# Patient Record
Sex: Female | Born: 1985 | Race: White | Hispanic: No | Marital: Single | Smoking: Current some day smoker
Health system: Southern US, Community
[De-identification: ages and names within clinical notes are randomized; demographics above are authoritative.]

## PROBLEM LIST (undated history)

## (undated) DIAGNOSIS — F419 Anxiety disorder, unspecified: Secondary | ICD-10-CM

## (undated) DIAGNOSIS — A6000 Herpesviral infection of urogenital system, unspecified: Secondary | ICD-10-CM

## (undated) DIAGNOSIS — F329 Major depressive disorder, single episode, unspecified: Secondary | ICD-10-CM

## (undated) DIAGNOSIS — F1910 Other psychoactive substance abuse, uncomplicated: Secondary | ICD-10-CM

## (undated) DIAGNOSIS — F32A Depression, unspecified: Secondary | ICD-10-CM

## (undated) DIAGNOSIS — N61 Mastitis without abscess: Secondary | ICD-10-CM

## (undated) HISTORY — PX: NO PAST SURGERIES: SHX2092

## (undated) HISTORY — DX: Mastitis without abscess: N61.0

## (undated) HISTORY — DX: Herpesviral infection of urogenital system, unspecified: A60.00

---

## 2005-02-23 ENCOUNTER — Emergency Department (HOSPITAL_COMMUNITY): Admission: EM | Admit: 2005-02-23 | Discharge: 2005-02-23 | Payer: Self-pay | Admitting: *Deleted

## 2005-09-20 ENCOUNTER — Emergency Department (HOSPITAL_COMMUNITY): Admission: EM | Admit: 2005-09-20 | Discharge: 2005-09-20 | Payer: Self-pay | Admitting: Emergency Medicine

## 2006-10-14 ENCOUNTER — Emergency Department (HOSPITAL_COMMUNITY): Admission: EM | Admit: 2006-10-14 | Discharge: 2006-10-14 | Payer: Self-pay | Admitting: Emergency Medicine

## 2007-05-30 ENCOUNTER — Emergency Department (HOSPITAL_COMMUNITY): Admission: EM | Admit: 2007-05-30 | Discharge: 2007-05-30 | Payer: Self-pay | Admitting: *Deleted

## 2007-06-28 ENCOUNTER — Emergency Department (HOSPITAL_COMMUNITY): Admission: EM | Admit: 2007-06-28 | Discharge: 2007-06-29 | Payer: Self-pay | Admitting: Emergency Medicine

## 2007-08-29 ENCOUNTER — Emergency Department (HOSPITAL_COMMUNITY): Admission: EM | Admit: 2007-08-29 | Discharge: 2007-08-29 | Payer: Self-pay | Admitting: Emergency Medicine

## 2007-09-08 ENCOUNTER — Emergency Department (HOSPITAL_COMMUNITY): Admission: EM | Admit: 2007-09-08 | Discharge: 2007-09-08 | Payer: Self-pay | Admitting: Emergency Medicine

## 2008-03-03 ENCOUNTER — Emergency Department (HOSPITAL_COMMUNITY): Admission: EM | Admit: 2008-03-03 | Discharge: 2008-03-03 | Payer: Self-pay | Admitting: Emergency Medicine

## 2008-06-03 ENCOUNTER — Emergency Department (HOSPITAL_COMMUNITY): Admission: EM | Admit: 2008-06-03 | Discharge: 2008-06-03 | Payer: Self-pay | Admitting: Emergency Medicine

## 2008-08-15 ENCOUNTER — Emergency Department (HOSPITAL_COMMUNITY): Admission: EM | Admit: 2008-08-15 | Discharge: 2008-08-15 | Payer: Self-pay | Admitting: Emergency Medicine

## 2008-08-25 ENCOUNTER — Emergency Department (HOSPITAL_COMMUNITY): Admission: EM | Admit: 2008-08-25 | Discharge: 2008-08-25 | Payer: Self-pay | Admitting: Emergency Medicine

## 2009-01-02 ENCOUNTER — Emergency Department (HOSPITAL_COMMUNITY): Admission: EM | Admit: 2009-01-02 | Discharge: 2009-01-02 | Payer: Self-pay | Admitting: Emergency Medicine

## 2009-01-02 ENCOUNTER — Ambulatory Visit: Payer: Self-pay | Admitting: Psychiatry

## 2009-01-02 ENCOUNTER — Inpatient Hospital Stay (HOSPITAL_COMMUNITY): Admission: AD | Admit: 2009-01-02 | Discharge: 2009-01-03 | Payer: Self-pay | Admitting: Psychiatry

## 2009-01-14 ENCOUNTER — Emergency Department (HOSPITAL_COMMUNITY): Admission: EM | Admit: 2009-01-14 | Discharge: 2009-01-14 | Payer: Self-pay | Admitting: Emergency Medicine

## 2009-07-10 ENCOUNTER — Emergency Department (HOSPITAL_COMMUNITY): Admission: EM | Admit: 2009-07-10 | Discharge: 2009-07-10 | Payer: Self-pay | Admitting: Emergency Medicine

## 2009-10-28 ENCOUNTER — Emergency Department (HOSPITAL_COMMUNITY): Admission: EM | Admit: 2009-10-28 | Discharge: 2009-10-28 | Payer: Self-pay | Admitting: Emergency Medicine

## 2009-12-05 ENCOUNTER — Emergency Department (HOSPITAL_COMMUNITY): Admission: EM | Admit: 2009-12-05 | Discharge: 2009-12-05 | Payer: Self-pay | Admitting: Emergency Medicine

## 2010-05-23 ENCOUNTER — Emergency Department (HOSPITAL_COMMUNITY): Admission: EM | Admit: 2010-05-23 | Discharge: 2010-05-23 | Payer: Self-pay | Admitting: Emergency Medicine

## 2010-05-27 ENCOUNTER — Emergency Department (HOSPITAL_COMMUNITY): Admission: EM | Admit: 2010-05-27 | Discharge: 2010-05-27 | Payer: Self-pay | Admitting: Emergency Medicine

## 2010-12-03 ENCOUNTER — Emergency Department (HOSPITAL_COMMUNITY)
Admission: EM | Admit: 2010-12-03 | Discharge: 2010-12-03 | Payer: Self-pay | Source: Home / Self Care | Admitting: Emergency Medicine

## 2010-12-28 IMAGING — CR DG FOOT COMPLETE 3+V*R*
3 series · 3 of 3 positions shown · non-contrast
Comparison: [REDACTED]right ankle radiographs 05/23/2010.

CLINICAL DATA: Right hind foot pain radiating to plantar foot
without specific injury.

RIGHT FOOT COMPLETE - 3+ VIEW

[t foot ap left]
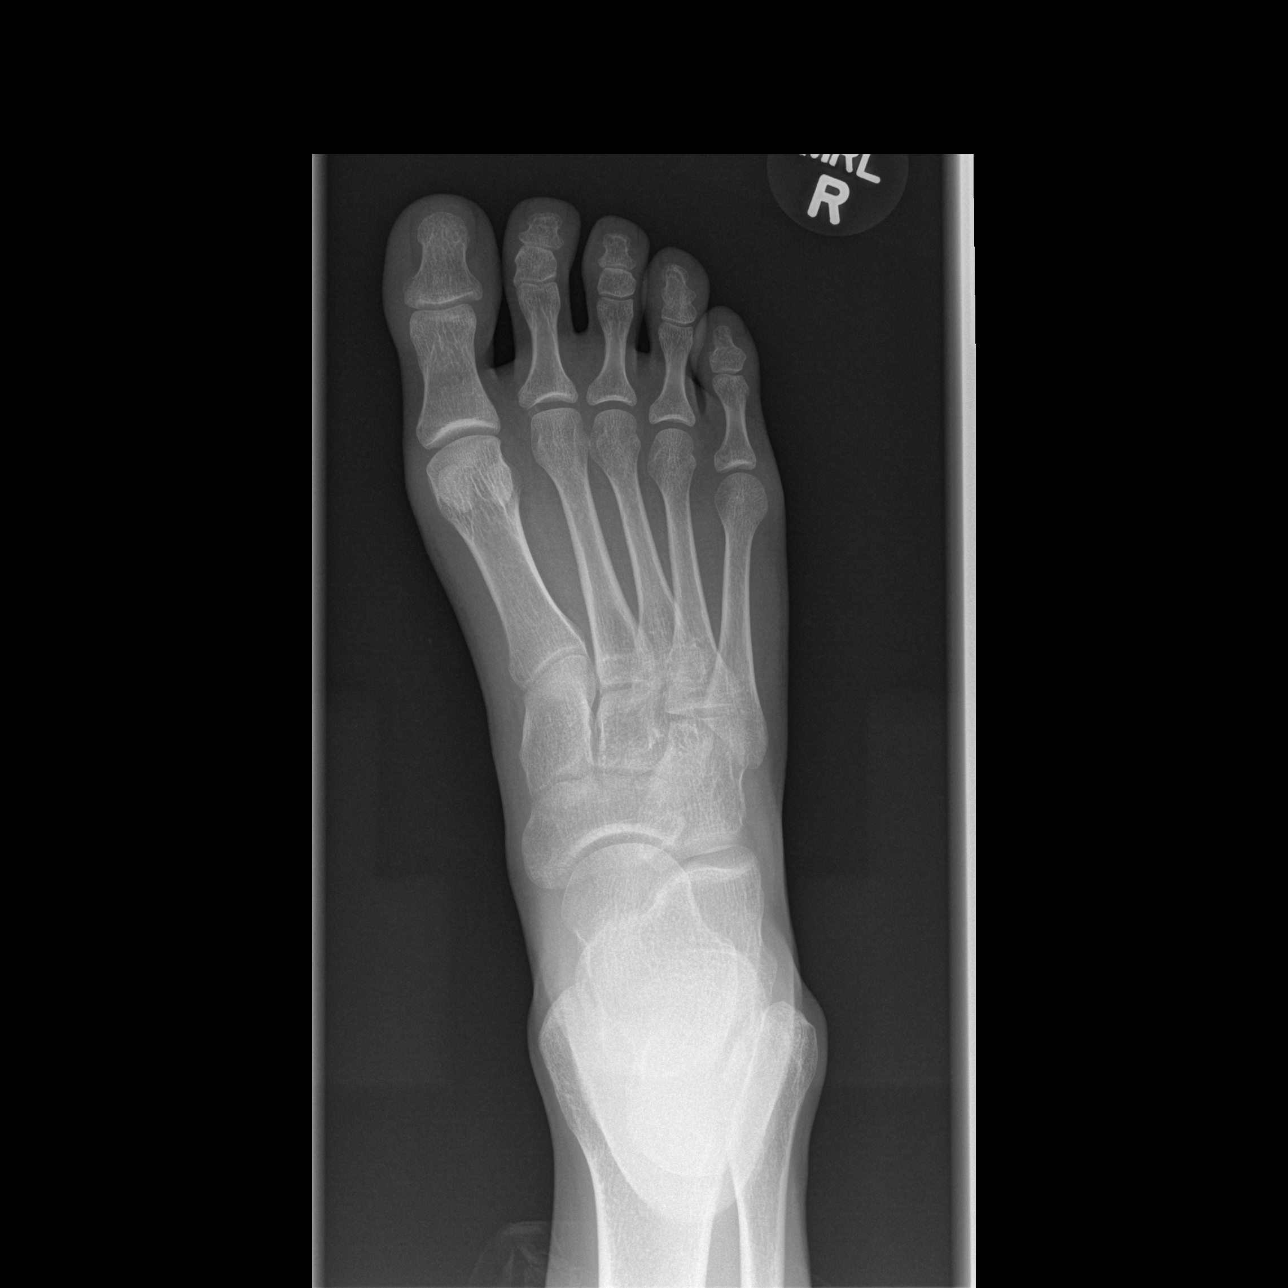

[t foot oblique left]
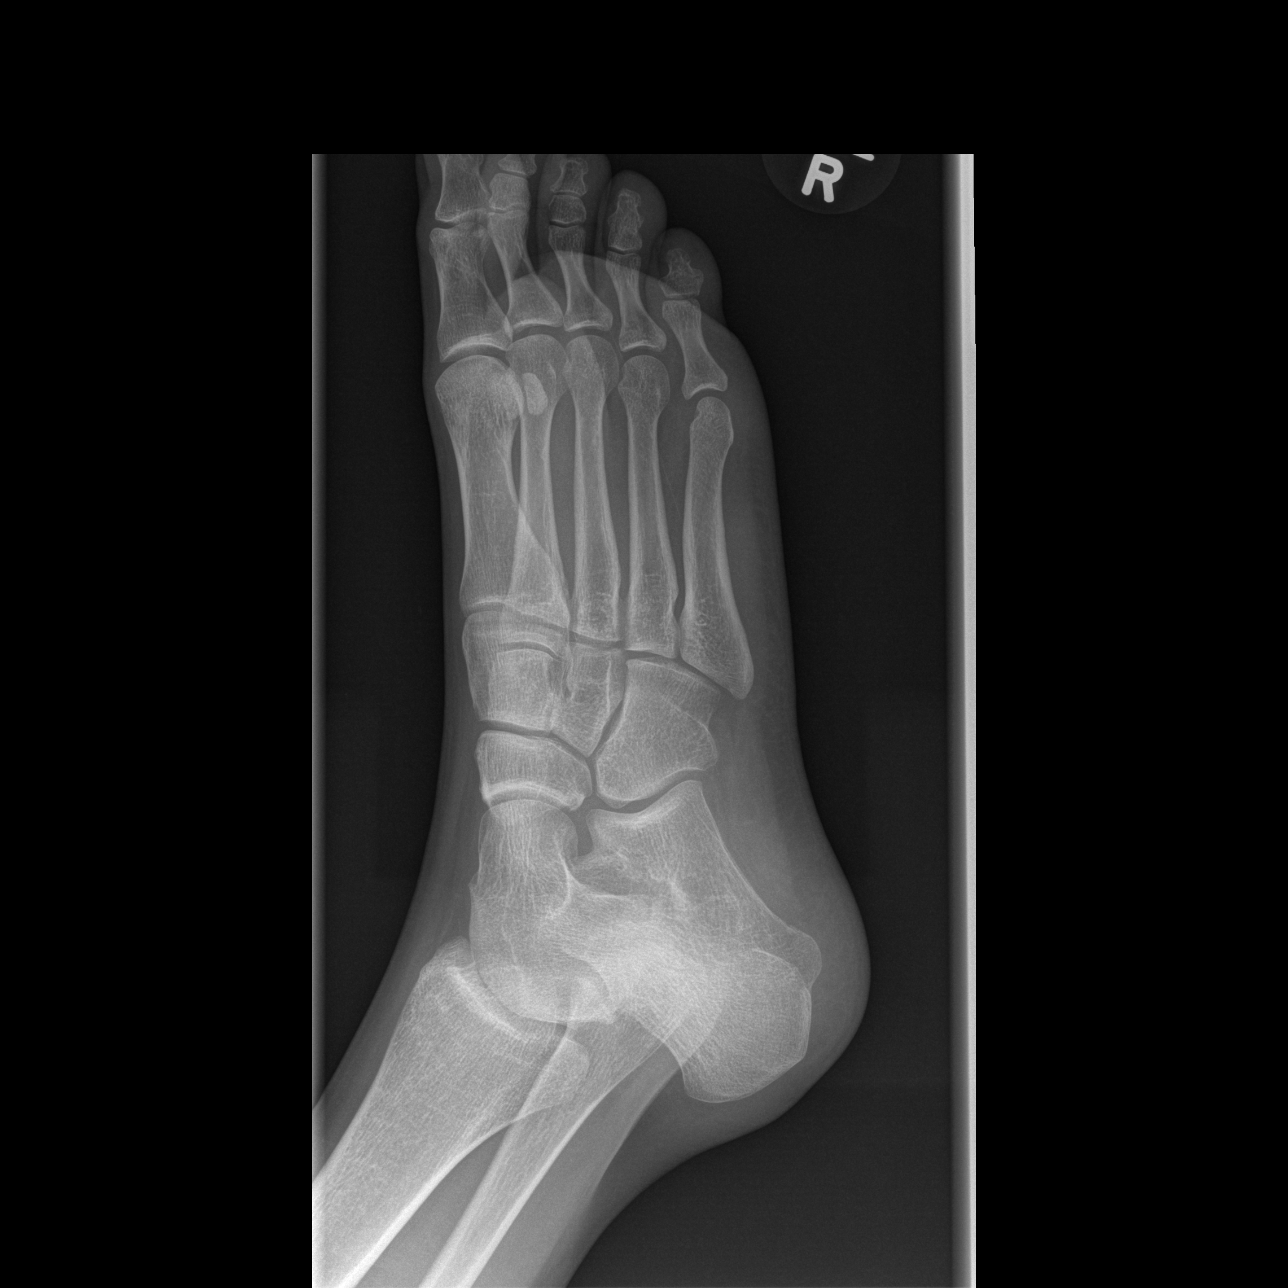

[t foot lat left]
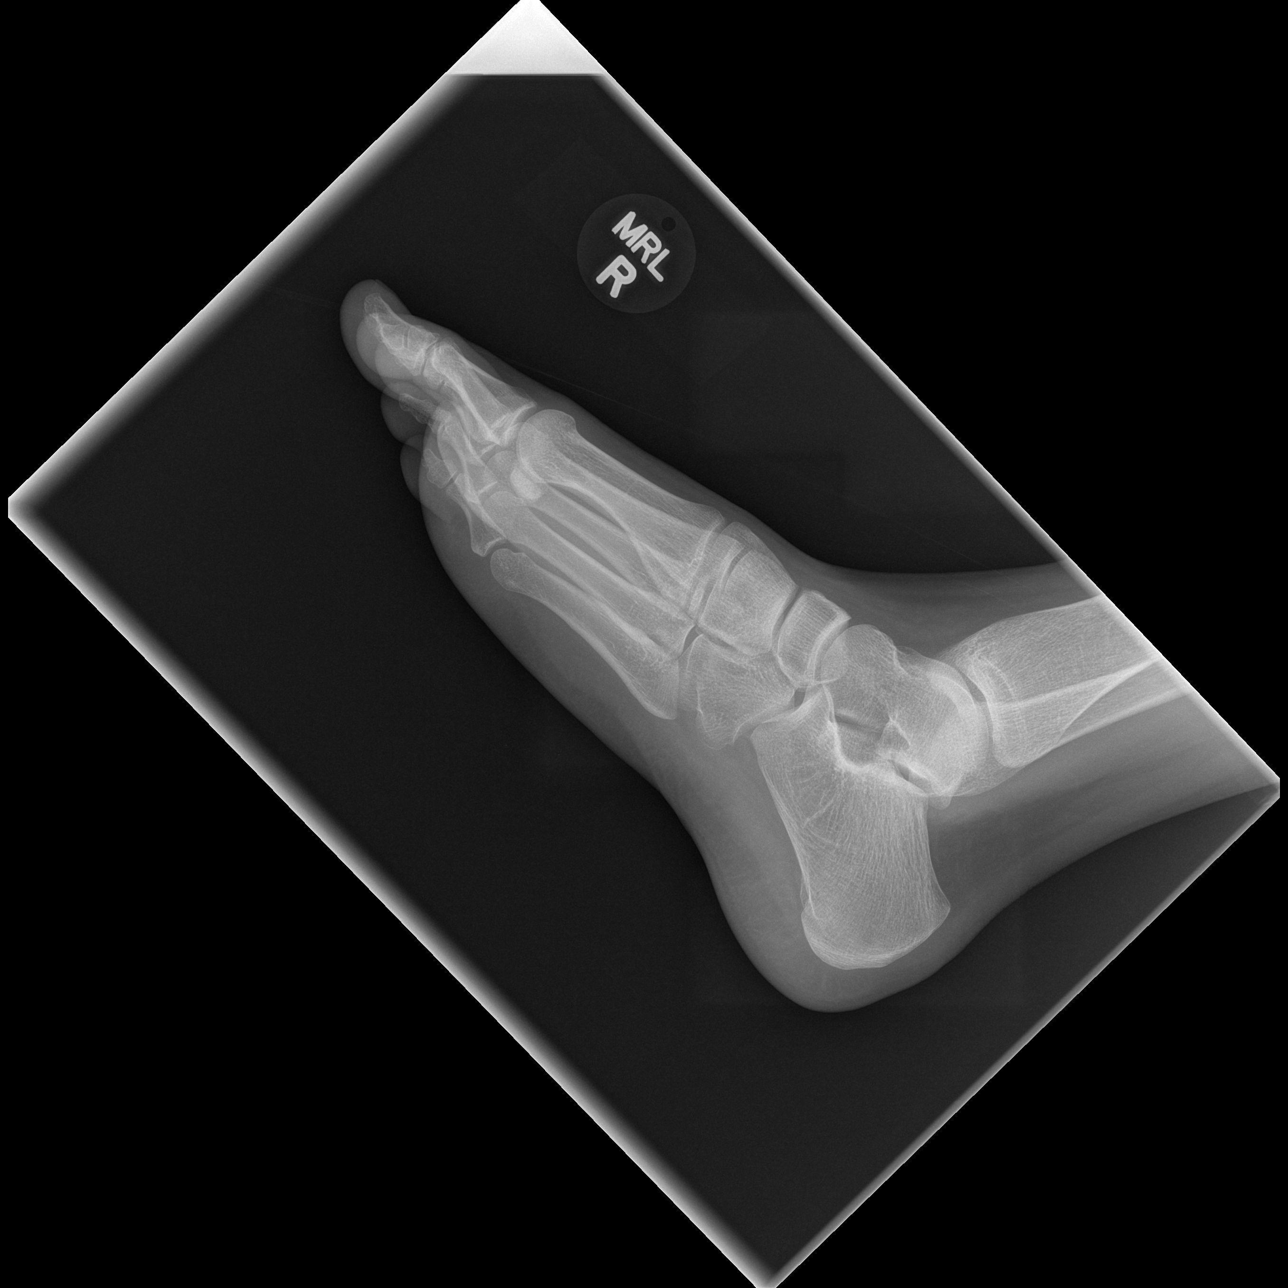

[3 of 3 positions shown; findings below may reference images not displayed]

FINDINGS: No significant osseous, articular or soft tissue
abnormalities seen.
IMPRESSION: Negative.

## 2011-02-22 LAB — GC/CHLAMYDIA PROBE AMP, GENITAL: Chlamydia, DNA Probe: NEGATIVE

## 2011-02-22 LAB — WET PREP, GENITAL

## 2011-03-14 LAB — URINALYSIS, ROUTINE W REFLEX MICROSCOPIC
Leukocytes, UA: NEGATIVE
Nitrite: NEGATIVE
Specific Gravity, Urine: 1.003 — ABNORMAL LOW (ref 1.005–1.030)
Urobilinogen, UA: 0.2 mg/dL (ref 0.0–1.0)
pH: 7 (ref 5.0–8.0)

## 2011-03-14 LAB — URINE MICROSCOPIC-ADD ON

## 2011-03-14 LAB — GC/CHLAMYDIA PROBE AMP, GENITAL
Chlamydia, DNA Probe: NEGATIVE
GC Probe Amp, Genital: NEGATIVE

## 2011-03-23 LAB — DIFFERENTIAL
Basophils Absolute: 0.1 10*3/uL (ref 0.0–0.1)
Basophils Relative: 1 % (ref 0–1)
Eosinophils Absolute: 0.1 10*3/uL (ref 0.0–0.7)
Eosinophils Relative: 2 % (ref 0–5)
Neutrophils Relative %: 48 % (ref 43–77)

## 2011-03-23 LAB — BASIC METABOLIC PANEL
CO2: 29 mEq/L (ref 19–32)
Chloride: 107 mEq/L (ref 96–112)
Creatinine, Ser: 0.73 mg/dL (ref 0.4–1.2)
GFR calc Af Amer: >60 mL/min (ref >60)
Sodium: 144 mEq/L (ref 135–145)

## 2011-03-23 LAB — RAPID URINE DRUG SCREEN, HOSP PERFORMED
Barbiturates: NOT DETECTED
Opiates: NOT DETECTED

## 2011-03-23 LAB — URINE DRUGS OF ABUSE SCREEN W ALC, ROUTINE (REF LAB)
Cocaine Metabolites: NEGATIVE
Creatinine,U: 121.8 mg/dL
Ethyl Alcohol: 34 mg/dL — ABNORMAL HIGH (ref <10)
Marijuana Metabolite: POSITIVE — AB
Methadone: NEGATIVE
Opiate Screen, Urine: POSITIVE — AB
Propoxyphene: NEGATIVE

## 2011-03-23 LAB — THC (MARIJUANA), URINE, CONFIRMATION: Marijuana, Ur-Confirmation: >500 ng/mL

## 2011-03-23 LAB — ETHANOL: Alcohol, Ethyl (B): 270 mg/dL — ABNORMAL HIGH (ref 0–10)

## 2011-03-23 LAB — CBC
HCT: 47 % — ABNORMAL HIGH (ref 36.0–46.0)
MCHC: 33.6 g/dL (ref 30.0–36.0)
MCV: 93.8 fL (ref 78.0–100.0)
Platelets: 247 10*3/uL (ref 150–400)
RDW: 12.1 % (ref 11.5–15.5)
WBC: 6.3 10*3/uL (ref 4.0–10.5)

## 2011-03-23 LAB — OPIATE, QUANTITATIVE, URINE
Codeine Urine: NEGATIVE ng/mL
Hydromorphone GC/MS Conf: 450 ng/mL
Oxycodone, ur: NEGATIVE ng/mL

## 2011-03-23 LAB — BENZODIAZEPINE, QUANTITATIVE, URINE
Alprazolam (GC/LC/MS), ur confirm: 160 ng/mL
Flurazepam GC/MS Conf: NEGATIVE
Nordiazepam GC/MS Conf: NEGATIVE

## 2011-03-23 LAB — ETHANOL CONFIRM, URINE: Ethanol, Ur - Confirmation: 31 mg/dL

## 2011-03-23 LAB — PREGNANCY, URINE: Preg Test, Ur: NEGATIVE

## 2011-04-21 NOTE — Discharge Summary (Signed)
Chelsey Torres, Chelsey Torres                 ACCOUNT NO.:  [id number]   MEDICAL RECORD NO.:  [id number]          PATIENT TYPE:  IPS   LOCATION:  0503                          FACILITY:  [facility]   PHYSICIAN:  Geoffery Lyons, M.D.      DATE OF BIRTH:  07/06/1986   DATE OF ADMISSION:  01/02/2009  DATE OF DISCHARGE:  01/03/2009                               DISCHARGE SUMMARY   TIME OF DISCHARGE:  9:35 a.m.   IDENTIFYING INFORMATION:  A 25 year old Caucasian female.  This is an  involuntary admission.   HISTORY OF PRESENT ILLNESS:  This is a brief discharge summary and the  first inpatient admission for this pleasant 25 year old who was referred  by the emergency room after she presented there with an alcohol level of  270 mg/dL and had a self-inflicted 4 cm laceration on her left medial  malleolus that required staples for closure.  She reports that her story  actually started on Tuesday late at night when she found her birth  mother in the bathroom injecting illicit drugs.  She had invited her  mother back into her house in order to give her a chance to resume their  relationship and was very upset that she found her doing this.  They had  a verbal fight, and her mother left the house.  She was so upset that  she drank a fairly large amount of alcohol and says she recognizes that  was not a constructive way to cope.  She also cut her left ankle.  She  reports a distant history of cutting in her teens and has not done this  for many years.  She says she had no intention of killing herself and  recognizes that she cut too deep.  She endorses the need for counseling  and would like to continue outpatient treatment.  She denies any  suicidal thoughts today or any homicidal thoughts towards her mother.  She is fully coherent and alert with no signs of alcohol withdrawal.  She says her fiance is living with her for the past year and have a good  and supportive relationship, and she will return to her own  home.  She  has one legal charge currently for a DWI incurred in the summer of 2009.  She works full-time at [employer] has no other legal charges.   MEDICAL HISTORY:  The patient was stabilized in the [facility]  emergency room and received staples to her left ankle laceration which  appears to be healing well.   CURRENT MEDICATIONS:  Alprazolam which she takes intermittently.  Does  not have a prescription for this.   DRUG ALLERGIES:  None.   MENTAL STATUS EXAM TODAY:  A fully alert female, pleasant, cooperative,  polite, good eye contact, oriented x4, in full contact with reality.  __________ for outpatient counseling, and she will continue supervised  probation for her current DWI.   She was discharged on no medications.      Margaret A. Scott, N.P.      Geoffery Lyons, M.D.  Electronically Signed  MAS/MEDQ  D:  01/03/2009  T:  01/03/2009  Job:  [id number]

## 2011-06-12 ENCOUNTER — Emergency Department (HOSPITAL_COMMUNITY): Payer: Self-pay

## 2011-06-12 ENCOUNTER — Emergency Department (HOSPITAL_COMMUNITY)
Admission: EM | Admit: 2011-06-12 | Discharge: 2011-06-12 | Disposition: A | Discharge status: Home / Self Care | Payer: Self-pay | Attending: Emergency Medicine | Admitting: Emergency Medicine

## 2011-06-12 DIAGNOSIS — M79609 Pain in unspecified limb: Secondary | ICD-10-CM | POA: Insufficient documentation

## 2011-06-12 DIAGNOSIS — W108XXA Fall (on) (from) other stairs and steps, initial encounter: Secondary | ICD-10-CM | POA: Insufficient documentation

## 2011-06-12 DIAGNOSIS — S93409A Sprain of unspecified ligament of unspecified ankle, initial encounter: Secondary | ICD-10-CM | POA: Insufficient documentation

## 2011-06-12 DIAGNOSIS — M7989 Other specified soft tissue disorders: Secondary | ICD-10-CM | POA: Insufficient documentation

## 2011-06-12 DIAGNOSIS — M25579 Pain in unspecified ankle and joints of unspecified foot: Secondary | ICD-10-CM | POA: Insufficient documentation

## 2011-08-03 ENCOUNTER — Emergency Department (HOSPITAL_COMMUNITY)
Admission: EM | Admit: 2011-08-03 | Discharge: 2011-08-03 | Disposition: A | Discharge status: Home / Self Care | Payer: Self-pay | Attending: Emergency Medicine | Admitting: Emergency Medicine

## 2011-08-03 DIAGNOSIS — S01309A Unspecified open wound of unspecified ear, initial encounter: Secondary | ICD-10-CM | POA: Insufficient documentation

## 2011-08-03 DIAGNOSIS — IMO0002 Reserved for concepts with insufficient information to code with codable children: Secondary | ICD-10-CM | POA: Insufficient documentation

## 2011-08-31 LAB — URINALYSIS, ROUTINE W REFLEX MICROSCOPIC
Bilirubin Urine: NEGATIVE
Ketones, ur: NEGATIVE
Nitrite: NEGATIVE
Protein, ur: NEGATIVE
Urobilinogen, UA: 0.2

## 2011-08-31 LAB — PREGNANCY, URINE: Preg Test, Ur: NEGATIVE

## 2011-08-31 LAB — URINE MICROSCOPIC-ADD ON

## 2011-09-03 LAB — BASIC METABOLIC PANEL
Calcium: 8.3 — ABNORMAL LOW
GFR calc Af Amer: >60
GFR calc non Af Amer: >60
Glucose, Bld: 113 — ABNORMAL HIGH
Potassium: 3.2 — ABNORMAL LOW
Sodium: 141

## 2011-09-03 LAB — DIFFERENTIAL
Basophils Absolute: 0
Lymphocytes Relative: 20
Lymphs Abs: 2
Monocytes Absolute: 0.5
Neutro Abs: 7.4

## 2011-09-03 LAB — ACETAMINOPHEN LEVEL: Acetaminophen (Tylenol), Serum: <10 — ABNORMAL LOW

## 2011-09-03 LAB — CBC
Hemoglobin: 13.8
RBC: 4.27
RDW: 13.1
WBC: 9.9

## 2011-09-03 LAB — ETHANOL: Alcohol, Ethyl (B): 224 — ABNORMAL HIGH

## 2011-09-03 LAB — RAPID URINE DRUG SCREEN, HOSP PERFORMED
Amphetamines: POSITIVE — AB
Barbiturates: NOT DETECTED
Cocaine: NOT DETECTED
Opiates: NOT DETECTED

## 2011-09-17 LAB — URINALYSIS, ROUTINE W REFLEX MICROSCOPIC
Bilirubin Urine: NEGATIVE
Ketones, ur: NEGATIVE
Nitrite: NEGATIVE
Protein, ur: 100 — AB
pH: 6.5

## 2011-09-17 LAB — I-STAT 8, (EC8 V) (CONVERTED LAB)
BUN: 4 — ABNORMAL LOW
Chloride: 110
Glucose, Bld: 99
HCT: 44
Hemoglobin: 15
Operator id: 277751
Sodium: 144

## 2011-09-17 LAB — DIFFERENTIAL
Basophils Absolute: 0
Basophils Relative: 0
Eosinophils Absolute: 0.2
Eosinophils Relative: 1
Lymphocytes Relative: 20
Monocytes Absolute: 0.6

## 2011-09-17 LAB — CBC
HCT: 41.7
Hemoglobin: 14.3
MCHC: 34.2
MCV: 93.1
Platelets: 264
RDW: 11.8

## 2011-09-17 LAB — RAPID URINE DRUG SCREEN, HOSP PERFORMED
Amphetamines: NOT DETECTED
Barbiturates: NOT DETECTED
Cocaine: NOT DETECTED
Opiates: NOT DETECTED

## 2011-09-17 LAB — URINE MICROSCOPIC-ADD ON

## 2011-12-03 ENCOUNTER — Encounter: Payer: Self-pay | Admitting: Emergency Medicine

## 2011-12-03 ENCOUNTER — Emergency Department (HOSPITAL_COMMUNITY)
Admission: EM | Admit: 2011-12-03 | Discharge: 2011-12-03 | Discharge status: Against Medical Advice | Payer: Self-pay | Attending: Emergency Medicine | Admitting: Emergency Medicine

## 2011-12-03 DIAGNOSIS — X58XXXA Exposure to other specified factors, initial encounter: Secondary | ICD-10-CM | POA: Insufficient documentation

## 2011-12-03 DIAGNOSIS — S0180XA Unspecified open wound of other part of head, initial encounter: Secondary | ICD-10-CM | POA: Insufficient documentation

## 2011-12-03 NOTE — ED Notes (Signed)
Pt arrives with fresh & dried blood on face, laceration noted, here with boyfriend and father, unable to obtain information re: mechanism of injury. Pt alert, NAD, interactive, ambulatory, speech clear, calm & aggravated. Father reports she has had alcohol to drink. Father and boyfriend "not sure what happened". Prior to registration & in registration & triage: pt uncooprative, not answering questions, argumentative, beligerant to RN at front door, registration and triage RN. Pt unable to rationalize. Attempted to explain plan & process, unable. Choosing not to be seen. Left with father & boyfriend.

## 2011-12-03 NOTE — ED Notes (Signed)
Pt. Refused to answer triage nurses questions during initial encounter , presents with dried blood at right lateral eyebrow .

## 2011-12-03 NOTE — ED Notes (Signed)
Pt. Refused initial vital signs taken by emt.

## 2012-02-25 ENCOUNTER — Emergency Department (HOSPITAL_COMMUNITY)
Admission: EM | Admit: 2012-02-25 | Discharge: 2012-02-25 | Disposition: A | Discharge status: Home / Self Care | Payer: Self-pay | Attending: Emergency Medicine | Admitting: Emergency Medicine

## 2012-02-25 DIAGNOSIS — R69 Illness, unspecified: Secondary | ICD-10-CM | POA: Insufficient documentation

## 2012-02-25 NOTE — ED Notes (Signed)
Pt stated she was leaving and going to [facility].  Encouraged pt to stay.  Pt seen leaving ED with another person, stated she was going to [facility].  Pt seen leaving ED with steady gait.

## 2012-11-05 ENCOUNTER — Encounter (HOSPITAL_COMMUNITY): Payer: Self-pay | Admitting: Emergency Medicine

## 2012-11-05 ENCOUNTER — Emergency Department (HOSPITAL_COMMUNITY)
Admission: EM | Admit: 2012-11-05 | Discharge: 2012-11-05 | Disposition: A | Discharge status: Home / Self Care | Payer: Self-pay | Attending: Emergency Medicine | Admitting: Emergency Medicine

## 2012-11-05 DIAGNOSIS — R197 Diarrhea, unspecified: Secondary | ICD-10-CM | POA: Insufficient documentation

## 2012-11-05 DIAGNOSIS — F17200 Nicotine dependence, unspecified, uncomplicated: Secondary | ICD-10-CM | POA: Insufficient documentation

## 2012-11-05 DIAGNOSIS — A059 Bacterial foodborne intoxication, unspecified: Secondary | ICD-10-CM | POA: Insufficient documentation

## 2012-11-05 DIAGNOSIS — R112 Nausea with vomiting, unspecified: Secondary | ICD-10-CM | POA: Insufficient documentation

## 2012-11-05 NOTE — ED Provider Notes (Signed)
History     CSN: [id number]  Arrival date & time 11/05/12  1151   First MD Initiated Contact with Patient 11/05/12 1215      Chief Complaint  Patient presents with  . flu evaluation    (Consider location/radiation/quality/duration/timing/severity/associated sxs/prior treatment) HPI  Chelsey Torres is a 26 y.o. female complaining of resolved nausea and vomiting with diarrhea yesterday. Patient right ear are tender point and everyone that had that meal became ill. Patient denies any current abdominal pain, nausea or vomiting. She was never febrile. She is currently tolerating by mouth and asymptomatic    History reviewed. No pertinent past medical history.  Past Surgical History  Procedure Date  . Tonsillectomy     No family history on file.  History  Substance Use Topics  . Smoking status: Current Every Day Smoker -- 10 years  . Smokeless tobacco: Not on file  . Alcohol Use: No     Comment: socially    OB History    Grav Para Term Preterm Abortions TAB SAB Ect Mult Living                  Review of Systems  Constitutional: Negative for fever.  Respiratory: Negative for shortness of breath.   Cardiovascular: Negative for chest pain.  Gastrointestinal: Positive for nausea, vomiting and diarrhea. Negative for abdominal pain.  All other systems reviewed and are negative.    Allergies  Review of patient's allergies indicates no known allergies.  Home Medications  No current outpatient prescriptions on file.  BP 125/74  Pulse 93  Temp 98.2 F (36.8 C) (Oral)  Resp 18  SpO2 99%  LMP 10/26/2012  Physical Exam  Nursing note and vitals reviewed. Constitutional: She is oriented to person, place, and time. She appears well-developed and well-nourished. No distress.  HENT:  Head: Normocephalic.  Eyes: Conjunctivae normal and EOM are normal.  Cardiovascular: Normal rate.   Pulmonary/Chest: Effort normal. No stridor.  Abdominal: Soft. Bowel sounds are  normal. She exhibits no distension and no mass. There is no tenderness. There is no rebound and no guarding.  Musculoskeletal: Normal range of motion.  Neurological: She is alert and oriented to person, place, and time.  Psychiatric: She has a normal mood and affect.    ED Course  Procedures (including critical care time)  Labs Reviewed - No data to display No results found.   1. Food poisoning       MDM  History of present illness inconsistent with infectious process. I will clear the patient to return to work.        Wynetta Emery, PA-C 11/06/12 [phone]

## 2012-11-05 NOTE — ED Notes (Signed)
Pt presenting to ed with c/o "I ate the tenderloin of a deer and I got sick but I wasn't the only one" pt states I feel much better but because I work in [employer] I have to be cleared to go back to work. Pt states I need evaluation stating that I don't have the flu or that I'm not contagious. Pt denies abdominal pain, nausea, vomiting and diarrhea today.

## 2012-11-06 NOTE — ED Provider Notes (Signed)
Medical screening examination/treatment/procedure(s) were performed by non-physician practitioner and as supervising physician I was immediately available for consultation/collaboration.    Celene Kras, MD 11/06/12 [phone]

## 2013-07-23 ENCOUNTER — Emergency Department (HOSPITAL_COMMUNITY)
Admission: EM | Admit: 2013-07-23 | Discharge: 2013-07-23 | Disposition: A | Discharge status: Home / Self Care | Payer: Self-pay | Attending: Emergency Medicine | Admitting: Emergency Medicine

## 2013-07-23 ENCOUNTER — Encounter (HOSPITAL_COMMUNITY): Payer: Self-pay | Admitting: Emergency Medicine

## 2013-07-23 DIAGNOSIS — Y9389 Activity, other specified: Secondary | ICD-10-CM | POA: Insufficient documentation

## 2013-07-23 DIAGNOSIS — Y9289 Other specified places as the place of occurrence of the external cause: Secondary | ICD-10-CM | POA: Insufficient documentation

## 2013-07-23 DIAGNOSIS — X58XXXA Exposure to other specified factors, initial encounter: Secondary | ICD-10-CM | POA: Insufficient documentation

## 2013-07-23 DIAGNOSIS — Y990 Civilian activity done for income or pay: Secondary | ICD-10-CM | POA: Insufficient documentation

## 2013-07-23 DIAGNOSIS — F17200 Nicotine dependence, unspecified, uncomplicated: Secondary | ICD-10-CM | POA: Insufficient documentation

## 2013-07-23 DIAGNOSIS — T2210XA Burn of first degree of shoulder and upper limb, except wrist and hand, unspecified site, initial encounter: Secondary | ICD-10-CM | POA: Insufficient documentation

## 2013-07-23 MED ORDER — TRAMADOL HCL 50 MG PO TABS
50.0000 mg | ORAL_TABLET | Freq: Four times a day (QID) | ORAL | Status: DC | PRN
Start: 2013-07-23 — End: 2013-11-11

## 2013-07-23 MED ORDER — CEPHALEXIN 500 MG PO CAPS: 500.0000 mg | ORAL_CAPSULE | Freq: Four times a day (QID) | ORAL | Status: DC

## 2013-07-23 MED ORDER — SILVER SULFADIAZINE 1 % EX CREA
TOPICAL_CREAM | Freq: Once | CUTANEOUS | Status: AC
  Administered 2013-07-23: 20:00:00 via TOPICAL
  Filled 2013-07-23: qty 50

## 2013-07-23 NOTE — ED Notes (Addendum)
Pt alert, arrives from work, c/o burn to right arm, onset was several days ago, resp even unlabored, skin pwd

## 2013-07-23 NOTE — ED Provider Notes (Signed)
CSN: [id number]     Arrival date & time 07/23/13  1821 History  This chart was scribed for non-physician practitioner, Sharilyn Sites, PA-C working with Donnetta Hutching, MD by Greggory Stallion, ED scribe. This patient was seen in room WTR8/WTR8 and the patient's care was started at 6:32 PM.    Chief Complaint  Patient presents with  . Burn    Right Arm   The history is provided by the patient. No language interpreter was used.   HPI Comments: Chelsey Torres is a 27 y.o. 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 She states initially t formed a large blister but her mom popped the blister for her and put A&D ointment and gauze over it for her. Patient has continued cleaning the wound daily and applying new dressings but now the redness is starting to progress down her arm and she has concern that it is becoming infected.  Patient denies any new chemical, bacterial, or foreign body exposure to the burn.  She has been taking naproxen for pain with no relief.   History reviewed. No pertinent past medical history. Past Surgical History  Procedure Laterality Date  . Tonsillectomy     History reviewed. No pertinent family history. History  Substance Use Topics  . Smoking status: Current Every Day Smoker -- 10 years  . Smokeless tobacco: Not on file  . Alcohol Use: No     Comment: socially   OB History   Grav Para Term Preterm Abortions TAB SAB Ect Mult Living                 Review of Systems  Skin: Positive for wound.  All other systems reviewed and are negative.    Allergies  Review of patient's allergies indicates no known allergies.  Home Medications  No current outpatient prescriptions on file.  BP 121/85  Pulse 90  Temp(Src) 98.9 F (37.2 C) (Oral)  Resp 20  Wt 105 lb (47.628 kg)  SpO2 99%  LMP 07/23/2013  Physical Exam  Nursing note and vitals  reviewed. Constitutional: She is oriented to person, place, and time. She appears well-developed and well-nourished. No distress.  HENT:  Head: Normocephalic and atraumatic.  Eyes: Conjunctivae and EOM are normal.  Neck: Normal range of motion.  Cardiovascular: Normal rate, regular rhythm and normal heart sounds.   Pulmonary/Chest: Breath sounds normal. No respiratory distress. She has no wheezes.  Musculoskeletal: Normal range of motion.  Neurological: She is alert and oriented to person, place, and time.  Skin: Skin is warm and dry. She is not diaphoretic.  3 x 2 cm burn to right medial upper arm, TTP with clear drainage and epithelialization; large area of surrounding erythema  Psychiatric: She has a normal mood and affect.    ED Course   Procedures (including critical care time)  DIAGNOSTIC STUDIES: Oxygen Saturation is 99% on RA, normal by my interpretation.    COORDINATION OF CARE: 6:47 PM-Discussed treatment plan which includes burn cream and short course of antibiotics with pt at bedside and pt agreed to plan.   Labs Reviewed - No data to display No results found.  1. First degree burn of arm, initial encounter     MDM   Burn with some clear drainage, moderate surrounding erythema.  Silvadene cream applied.  Rx keflex and tramadol.  Instructed to continue home wound care and applying  Silvadene. Patient will [facility] if symptoms not improving in the next few days.  Discussed plan with patient, she agreed. Return precautions advised.  I personally performed the services described in this documentation, which was scribed in my presence. The recorded information has been reviewed and is accurate.   Garlon Hatchet, PA-C 07/23/13 2000

## 2013-07-23 NOTE — ED Provider Notes (Signed)
Medical screening examination/treatment/procedure(s) were performed by non-physician practitioner and as supervising physician I was immediately available for consultation/collaboration.  Donnetta Hutching, MD 07/23/13 2233

## 2013-11-11 ENCOUNTER — Emergency Department (HOSPITAL_COMMUNITY)
Admission: EM | Admit: 2013-11-11 | Discharge: 2013-11-11 | Disposition: A | Discharge status: Home / Self Care | Payer: Self-pay | Attending: Emergency Medicine | Admitting: Emergency Medicine

## 2013-11-11 ENCOUNTER — Encounter (HOSPITAL_COMMUNITY): Payer: Self-pay | Admitting: Emergency Medicine

## 2013-11-11 ENCOUNTER — Emergency Department (HOSPITAL_COMMUNITY): Payer: Self-pay

## 2013-11-11 DIAGNOSIS — F17200 Nicotine dependence, unspecified, uncomplicated: Secondary | ICD-10-CM | POA: Insufficient documentation

## 2013-11-11 DIAGNOSIS — J329 Chronic sinusitis, unspecified: Secondary | ICD-10-CM

## 2013-11-11 DIAGNOSIS — R059 Cough, unspecified: Secondary | ICD-10-CM | POA: Insufficient documentation

## 2013-11-11 DIAGNOSIS — R05 Cough: Secondary | ICD-10-CM | POA: Insufficient documentation

## 2013-11-11 DIAGNOSIS — J40 Bronchitis, not specified as acute or chronic: Secondary | ICD-10-CM

## 2013-11-11 DIAGNOSIS — J209 Acute bronchitis, unspecified: Secondary | ICD-10-CM | POA: Insufficient documentation

## 2013-11-11 MED ORDER — AMOXICILLIN-POT CLAVULANATE 875-125 MG PO TABS: ORAL_TABLET | ORAL | Status: DC

## 2013-11-11 MED ORDER — ALBUTEROL SULFATE HFA 108 (90 BASE) MCG/ACT IN AERS
2.0000 | INHALATION_SPRAY | Freq: Once | RESPIRATORY_TRACT | Status: AC
  Administered 2013-11-11: 2 via RESPIRATORY_TRACT
  Filled 2013-11-11: qty 6.7

## 2013-11-11 MED ORDER — DOXYCYCLINE HYCLATE 100 MG PO CAPS: 100.0000 mg | ORAL_CAPSULE | Freq: Two times a day (BID) | ORAL | Status: DC

## 2013-11-11 NOTE — ED Notes (Addendum)
Pt reports having a productive cough for one month. Pt reports a dark green sputum. Pt reports taking decongestant medications, however they became too expensive. Pt reports currently taking nasal sprays, which has not helped. Pt is a 0.5 pack per day smoker.

## 2013-11-11 NOTE — ED Provider Notes (Signed)
CSN: [id number]     Arrival date & time 11/11/13  1233 History  This chart was scribed for non-physician practitioner Raymon Mutton, PA-C working with Ethelda Chick, MD by Leone Payor, ED Scribe. This patient was seen in room WTR7/WTR7 and the patient's care was started at 1233.    Chief Complaint  Patient presents with  . Cough  . Nasal Congestion    The history is provided by the patient. No language interpreter was used.    HPI Comments: Chelsey Torres is a 27 y.o. female who presents to the Emergency Department complaining of 1 month of constant, unchanged sinus pressure, cough, congestion. She reports having yellow/green rhinorrhea and the cough is productive of dark green phlegm. She also has associated posttussis emesis. She reports developing chest pain from coughing which is worse with deep breathing. She has used Mucinex, Dayquil, nasal spray without relief. She denies sick contacts. She denies fever, abdominal pain, numbness, tingling, neck pain, neck stiffness, ear pain, ear discharge, sore throat, difficulty swallowing. Pt is a current everyday smoker and occasional alcohol user.    History reviewed. No pertinent past medical history. History reviewed. No pertinent past surgical history. No family history on file. History  Substance Use Topics  . Smoking status: Current Every Day Smoker -- 0.50 packs/day for 10 years    Types: Cigarettes  . Smokeless tobacco: Never Used  . Alcohol Use: Yes     Comment: socially   OB History   Grav Para Term Preterm Abortions TAB SAB Ect Mult Living                 Review of Systems  Constitutional: Negative for fever.  HENT: Positive for congestion, rhinorrhea and sinus pressure. Negative for ear pain, sore throat and trouble swallowing.   Respiratory: Positive for cough.   Cardiovascular: Positive for chest pain.  Gastrointestinal: Negative for abdominal pain.  Musculoskeletal: Negative for neck pain and neck stiffness.   Neurological: Negative for numbness.  All other systems reviewed and are negative.    Allergies  Tylenol  Home Medications   Current Outpatient Rx  Name  Route  Sig  Dispense  Refill  . flunisolide (NASALIDE) 25 MCG/ACT (0.025%) SOLN   Nasal   Place 2 sprays into the nose 2 (two) times daily.         Marland Kitchen guaiFENesin-dextromethorphan (ROBITUSSIN DM) 100-10 MG/5ML syrup   Oral   Take 5 mLs by mouth every 4 (four) hours as needed for cough.         . Multiple Vitamin (MULTIVITAMIN WITH MINERALS) TABS tablet   Oral   Take 1 tablet by mouth daily.         . vitamin C (ASCORBIC ACID) 500 MG tablet   Oral   Take 1,000 mg by mouth daily.         Marland Kitchen amoxicillin-clavulanate (AUGMENTIN) 875-125 MG per tablet      One po bid x 10 days   20 tablet   0    BP 111/65  Pulse 75  Temp(Src) 98.6 F (37 C) (Oral)  Resp 18  SpO2 99%  LMP 11/08/2013 Physical Exam  Nursing note and vitals reviewed. Constitutional: She is oriented to person, place, and time. She appears well-developed and well-nourished. No distress.  HENT:  Head: Normocephalic and atraumatic.  Right Ear: External ear normal.  Left Ear: External ear normal.  Mouth/Throat: Oropharynx is clear and moist. No oropharyngeal exudate.  Facial pressure upon palpation to  the maxillary sinuses bilaterally  Eyes: Conjunctivae and EOM are normal. Pupils are equal, round, and reactive to light. Right eye exhibits no discharge. Left eye exhibits no discharge.  Neck: Normal range of motion. Neck supple.  Negative neck stiffness Negative nuchal rigidity Negative cervical lymphadenopathy Negative meningeal signs  Cardiovascular: Normal rate, regular rhythm and normal heart sounds.   Pulses:      Radial pulses are 2+ on the right side, and 2+ on the left side.  Pulmonary/Chest: Effort normal and breath sounds normal. No respiratory distress. She has no wheezes. She has no rales.  Negative respiratory distress Patient able  to speak in full sentences without difficulty Negative use of accessory muscles  Musculoskeletal: Normal range of motion.  Full range of motion to upper and lower extremities bilaterally without difficulty noted  Lymphadenopathy:    She has no cervical adenopathy.  Neurological: She is alert and oriented to person, place, and time. She exhibits normal muscle tone. Coordination normal.  Skin: Skin is warm and dry. No rash noted. She is not diaphoretic. No erythema.  Psychiatric: She has a normal mood and affect. Her behavior is normal. Thought content normal.    ED Course  Procedures   DIAGNOSTIC STUDIES: Oxygen Saturation is 99% on RA, normal by my interpretation.    COORDINATION OF CARE: 2:05 PM Will order CXR. Discussed treatment plan with pt at bedside and pt agreed to plan.   Labs Review Labs Reviewed - No data to display Imaging Review Dg Chest 2 View  11/11/2013   CLINICAL DATA:  Productive cough.  Smoker  EXAM: CHEST  2 VIEW  COMPARISON:  10/28/2009.  FINDINGS: Sized heart. Clear lungs the lungs remain mildly hyperexpanded. Minimal scoliosis.  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  No acute abnormality   Electronically Signed   By: Gordan Payment M.D.   On: 11/11/2013 14:33    EKG Interpretation   None       MDM   1. Sinusitis   2. Bronchitis    Filed Vitals:   11/11/13 1256  BP: 111/65  Pulse: 75  Temp: 98.6 F (37 C)  TempSrc: Oral  Resp: 18  SpO2: 99%   I personally performed the services described in this documentation, which was scribed in my presence. The recorded information has been reviewed and is accurate.  Patient presenting to emergency department with cough, congestion, sinus pressure that has been ongoing for the past month. Patient reports she's been using over-the-counter medication with minimal relief. Alert and oriented. GCS 15. Heart rate and rhythm normal. Pulses palpable and strong, radial 2+ bilaterally. Lungs clear to  auscultation to upper and lower lobes bilaterally. Unremarkable oral exam. Negative cervical lymphadenopathy. Negative meningeal signs. Full range of motion to upper and lower extremities bilaterally without difficulty noted. Discomfort upon palpation to bilateral maxillary sinuses.  Chest x-ray identified mild changes COPD with mild progression-negative acute abnormalities identified. Patient stable, afebrile. Doubt pneumonia. Doubt meningitis. Suspicion to be viral infection-bronchitis, associated with sinus infection. Discharge patient with antibiotics for sinus infection. Discharge patient with albuterol inhaler. Referred patient to [facility] and wellness, pulmonology. Discussed with patient to rest and stay hydrated. Discussed with patient to closely monitor symptoms and if symptoms are to worsen or change report back to emergency department-strict return instructions given. Patient agreed to plan of care, understood, all questions answered.   Raymon Mutton, PA-C 11/12/13 [phone]

## 2013-11-12 NOTE — ED Provider Notes (Signed)
Medical screening examination/treatment/procedure(s) were performed by non-physician practitioner and as supervising physician I was immediately available for consultation/collaboration.  EKG Interpretation   None        Ethelda Chick, MD 11/12/13 [phone]

## 2014-03-26 ENCOUNTER — Encounter (HOSPITAL_COMMUNITY): Payer: Self-pay | Admitting: Emergency Medicine

## 2014-03-26 ENCOUNTER — Emergency Department (HOSPITAL_COMMUNITY)
Admission: EM | Admit: 2014-03-26 | Discharge: 2014-03-26 | Disposition: A | Discharge status: Home / Self Care | Payer: Self-pay | Attending: Emergency Medicine | Admitting: Emergency Medicine

## 2014-03-26 DIAGNOSIS — W269XXA Contact with unspecified sharp object(s), initial encounter: Secondary | ICD-10-CM | POA: Insufficient documentation

## 2014-03-26 DIAGNOSIS — F17200 Nicotine dependence, unspecified, uncomplicated: Secondary | ICD-10-CM | POA: Insufficient documentation

## 2014-03-26 DIAGNOSIS — Z79899 Other long term (current) drug therapy: Secondary | ICD-10-CM | POA: Insufficient documentation

## 2014-03-26 DIAGNOSIS — S61409A Unspecified open wound of unspecified hand, initial encounter: Secondary | ICD-10-CM | POA: Insufficient documentation

## 2014-03-26 DIAGNOSIS — IMO0002 Reserved for concepts with insufficient information to code with codable children: Secondary | ICD-10-CM | POA: Insufficient documentation

## 2014-03-26 DIAGNOSIS — Y9389 Activity, other specified: Secondary | ICD-10-CM | POA: Insufficient documentation

## 2014-03-26 DIAGNOSIS — Y990 Civilian activity done for income or pay: Secondary | ICD-10-CM | POA: Insufficient documentation

## 2014-03-26 DIAGNOSIS — Y9289 Other specified places as the place of occurrence of the external cause: Secondary | ICD-10-CM | POA: Insufficient documentation

## 2014-03-26 NOTE — ED Provider Notes (Signed)
CSN: [id number]     Arrival date & time 03/26/14  1035 History  This chart was scribed for non-physician practitioner, Mellody DrownLauren Lemmie Steinhaus, PA-C, working with No att. providers found, by Ellin MayhewMichael Levi, ED Scribe. This patient was seen in room TR06C/TR06C and the patient's care was started at 11:38 AM.  HPI Comments: Chelsey Torres is a 28 y.o. female who presents to the Emergency Department with a chief complaint of a laceration to the L hand. Patient reports that she accidentally cut herself in the palm of the L hand while cutting food at work today. She denies other injury. She reports last tetanus shot was less than 5 years ago. She presents to the ED with controlled bleeding.  States her work wanted her to come get injury evaluated and recommendations for how long she should be out.   The history is provided by the patient. No language interpreter was used.   History reviewed. No pertinent past medical history. History reviewed. No pertinent past surgical history. No family history on file. History  Substance Use Topics  . Smoking status: Current Every Day Smoker -- 0.50 packs/day for 10 years    Types: Cigarettes  . Smokeless tobacco: Never Used  . Alcohol Use: Yes     Comment: socially   OB History   Grav Para Term Preterm Abortions TAB SAB Ect Mult Living                 Review of Systems  Constitutional: Negative for fever and chills.  Respiratory: Negative for shortness of breath.   Gastrointestinal: Negative for nausea and vomiting.  Musculoskeletal: Negative for back pain, neck pain and neck stiffness.  Skin: Positive for wound (L hand). Negative for color change.  Neurological: Negative for weakness.  All other systems reviewed and are negative.  Allergies  Tylenol  Home Medications   Prior to Admission medications   Medication Sig Start Date End Date Taking? Authorizing Provider  amoxicillin-clavulanate (AUGMENTIN) 875-125 MG per tablet One po bid x 10 days 11/11/13    Marissa Sciacca, PA-C  flunisolide (NASALIDE) 25 MCG/ACT (0.025%) SOLN Place 2 sprays into the nose 2 (two) times daily.    Historical Provider, MD  guaiFENesin-dextromethorphan (ROBITUSSIN DM) 100-10 MG/5ML syrup Take 5 mLs by mouth every 4 (four) hours as needed for cough.    Historical Provider, MD  Multiple Vitamin (MULTIVITAMIN WITH MINERALS) TABS tablet Take 1 tablet by mouth daily.    Historical Provider, MD  vitamin C (ASCORBIC ACID) 500 MG tablet Take 1,000 mg by mouth daily.    Historical Provider, MD   Triage Vitals: BP 110/64  Pulse 80  Temp(Src) 98.2 F (36.8 C) (Oral)  Resp 18  Ht 5\' 5"  (1.651 m)  Wt 97 lb (43.999 kg)  BMI 16.14 kg/m2  SpO2 98%  LMP 03/14/2014  Physical Exam  Nursing note and vitals reviewed. Constitutional: She is oriented to person, place, and time. She appears well-developed and well-nourished. No distress.  HENT:  Head: Normocephalic and atraumatic.  Eyes: EOM are normal.  Neck: Neck supple.  Cardiovascular: Normal rate.   Pulmonary/Chest: Effort normal. No respiratory distress.  Musculoskeletal: Normal range of motion.       Right hand: She exhibits laceration.  0.5 cm superficial laceration to the volar surface of the palm, bleeding controlled. No obvious foreign bodies, and no erythema, full range of motion to the hand, no obvious tendon injury.  Neurological: She is alert and oriented to person, place, and time.  Skin: Skin is warm and dry.  Psychiatric: She has a normal mood and affect. Her behavior is normal.    ED Course  Procedures (including critical care time)  COORDINATION OF CARE: 11:37 AM-Treatment plan discussed with patient and patient agrees.  Labs Review Labs Reviewed - No data to display  Imaging Review No results found.   EKG Interpretation None      MDM   Final diagnoses:  Laceration   Pt with a 0.5 linear supperficial laceration to right palm.  Bleeding controlled. Td UTD.  Steri-strip and dressing  applied.  Pt reports working in [employer].  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 Discussed and treatment plan with the patient. Return precautions given. Reports understanding and no other concerns at this time.  Patient is stable for discharge at this time.  I personally performed the services described in this documentation, which was scribed in my presence. The recorded information has been reviewed and is accurate.    Clabe SealLauren M Phinley Schall, PA-C 03/27/14 [phone]

## 2014-03-26 NOTE — ED Notes (Signed)
Pt c/o while at work today cutting food she stabbed herself in the left palm of hand. Bleeding controlled at this time.

## 2014-03-26 NOTE — Discharge Instructions (Signed)
Call for a follow up appointment with a Family or Primary Care Provider.  Call Dr. Mina MarbleWeingold for further evaluation of your laceration, as needed. Return if Symptoms worsen.   Keep your wound clean and dry.

## 2014-03-30 NOTE — ED Provider Notes (Signed)
Medical screening examination/treatment/procedure(s) were performed by non-physician practitioner and as supervising physician I was immediately available for consultation/collaboration.   EKG Interpretation None       Toy BakerAnthony T Jessel Gettinger, MD 03/30/14 1420

## 2015-03-30 ENCOUNTER — Encounter (HOSPITAL_COMMUNITY): Payer: Self-pay

## 2015-03-30 ENCOUNTER — Emergency Department (HOSPITAL_COMMUNITY)
Admission: EM | Admit: 2015-03-30 | Discharge: 2015-03-30 | Disposition: A | Discharge status: Home / Self Care | Payer: BLUE CROSS/BLUE SHIELD | Attending: Emergency Medicine | Admitting: Emergency Medicine

## 2015-03-30 DIAGNOSIS — B370 Candidal stomatitis: Secondary | ICD-10-CM

## 2015-03-30 DIAGNOSIS — Z720 Tobacco use: Secondary | ICD-10-CM | POA: Insufficient documentation

## 2015-03-30 DIAGNOSIS — J029 Acute pharyngitis, unspecified: Secondary | ICD-10-CM | POA: Diagnosis present

## 2015-03-30 DIAGNOSIS — Z79899 Other long term (current) drug therapy: Secondary | ICD-10-CM | POA: Insufficient documentation

## 2015-03-30 LAB — RAPID STREP SCREEN (MED CTR MEBANE ONLY): Streptococcus, Group A Screen (Direct): NEGATIVE

## 2015-03-30 MED ORDER — NYSTATIN 100000 UNIT/ML MT SUSP: OROMUCOSAL | Status: DC

## 2015-03-30 MED ORDER — DEXAMETHASONE SODIUM PHOSPHATE 10 MG/ML IJ SOLN
10.0000 mg | Freq: Once | INTRAMUSCULAR | Status: AC
Start: 2015-03-30 — End: 2015-03-30
  Administered 2015-03-30: 10 mg via INTRAMUSCULAR
  Filled 2015-03-30: qty 1

## 2015-03-30 MED ORDER — IBUPROFEN 800 MG PO TABS: 800.0000 mg | ORAL_TABLET | Freq: Three times a day (TID) | ORAL | Status: DC

## 2015-03-30 MED ORDER — IBUPROFEN 400 MG PO TABS
800.0000 mg | ORAL_TABLET | Freq: Once | ORAL | Status: AC
  Administered 2015-03-30: 800 mg via ORAL
  Filled 2015-03-30: qty 4

## 2015-03-30 NOTE — ED Provider Notes (Signed)
CSN: [id number]     Arrival date & time 03/30/15  1337 History  This chart was scribed for non-physician practitioner, Jinny Sanders, PA-C working with Gilda Crease, MD, by Abel Presto, ED Scribe. This patient was seen in room TR05C/TR05C and the patient's care was started at 2:09 PM.   Chief Complaint  Patient presents with  . Sore Throat     Patient is a 29 y.o. female presenting with pharyngitis. The history is provided by the patient. No language interpreter was used.  Sore Throat   HPI Comments: Chelsey Torres is a 29 y.o. female who presents to the Emergency Department complaining of sore throat with onset 3 day ago. Pt notes associated mild subjective fever which has resolved, ear pain with onset 2 days ago, "white spots" to oropharynx, and odenophaygia. Pt took cephalexin for 2 days and tried ibuprofen for relief. Pt denies dysphagia, nausea, vomiting, abdominal pain, cough, nasal congestion, discharge, neck pain, chest pain, shortness of breath.  History reviewed. No pertinent past medical history. History reviewed. No pertinent past surgical history. No family history on file. History  Substance Use Topics  . Smoking status: Current Every Day Smoker -- 0.50 packs/day for 10 years    Types: Cigarettes  . Smokeless tobacco: Never Used  . Alcohol Use: Yes     Comment: socially   OB History    No data available     Review of Systems  Constitutional: Positive for fever (resovled).  HENT: Positive for sore throat. Negative for trouble swallowing.   Respiratory: Negative for cough.   Gastrointestinal: Negative for nausea and vomiting.      Allergies  Tylenol  Home Medications   Prior to Admission medications   Medication Sig Start Date End Date Taking? Authorizing Provider  amoxicillin-clavulanate (AUGMENTIN) 875-125 MG per tablet One po bid x 10 days 11/11/13   Marissa Sciacca, PA-C  flunisolide (NASALIDE) 25 MCG/ACT (0.025%) SOLN Place 2 sprays into the  nose 2 (two) times daily.    Historical Provider, MD  guaiFENesin-dextromethorphan (ROBITUSSIN DM) 100-10 MG/5ML syrup Take 5 mLs by mouth every 4 (four) hours as needed for cough.    Historical Provider, MD  ibuprofen (ADVIL,MOTRIN) 800 MG tablet Take 1 tablet (800 mg total) by mouth 3 (three) times daily. 03/30/15   Ladona Mow, PA-C  Multiple Vitamin (MULTIVITAMIN WITH MINERALS) TABS tablet Take 1 tablet by mouth daily.    Historical Provider, MD  nystatin (MYCOSTATIN) 100000 UNIT/ML suspension Swish, gargle and spit 5 mL by mouth 4 times daily for 14 days. 03/30/15   Ladona Mow, PA-C  vitamin C (ASCORBIC ACID) 500 MG tablet Take 1,000 mg by mouth daily.    Historical Provider, MD   BP 103/61 mmHg  Pulse 63  Temp(Src) 98.6 F (37 C) (Oral)  Resp 20  Ht  (1.651 m)  Wt 100 lb (45.36 kg)  BMI 16.64 kg/m2  SpO2 100%  LMP 02/18/2015 Physical Exam  Constitutional: She is oriented to person, place, and time. She appears well-developed and well-nourished. No distress.  HENT:  Head: Normocephalic and atraumatic.  Right Ear: Tympanic membrane normal.  Left Ear: Tympanic membrane normal.  Nasal turbinates non edematous and non erythematous Bilateral tonsillar exudates with no swelling  Eyes: Conjunctivae are normal. Right eye exhibits no discharge. Left eye exhibits no discharge. No scleral icterus.  Neck: Normal range of motion. Neck supple.  Pulmonary/Chest: Effort normal. No respiratory distress.  Musculoskeletal: Normal range of motion.  Lymphadenopathy:  She has cervical adenopathy (mild anterior).  Neurological: She is alert and oriented to person, place, and time.  Skin: Skin is warm and dry. She is not diaphoretic.  Psychiatric: She has a normal mood and affect. Her behavior is normal.  Nursing note and vitals reviewed.   ED Course  Procedures (including critical care time) DIAGNOSTIC STUDIES: Oxygen Saturation is 100% on room air, normal by my interpretation.     COORDINATION OF CARE: 2:15 PM Discussed treatment plan with patient at beside, the patient agrees with the plan and has no further questions at this time.   Labs Review Labs Reviewed  RAPID STREP SCREEN  CULTURE, GROUP A STREP  GC/CHLAMYDIA PROBE AMP ([place])    Imaging Review No results found.   EKG Interpretation None      MDM   Final diagnoses:  Pharyngitis  Oral thrush   Patient's signs and symptoms consistent with a pharyngitis, concerning for oral candidiasis with exudates on tonsils and on tongue. Rapid strep is negative today. Patient treated symptomatically. Patient afebrile, hemodynamically stable, well-appearing and in no acute distress throughout her stay. Patient tolerating by mouth well. There is no concern for PTA or retropharyngeal abscess. Given patient's presentation consistent with a candidiasis, in the absence of history of immunocompromise or long-term antibiotic use, offered further testing with Monospot and HIV. Patient declined these tests, stating [facility] based on time constraints today. I felt this was reasonable based on patient's nontoxic appearance. I discussed return precautions with patient, and patient verbalized understanding and agreement of this plan. Encouraged patient to call or return to the ER for any worsening of symptoms or should she have any questions or concerns.  I personally performed the services described in this documentation, which was scribed in my presence. The recorded information has been reviewed and is accurate.  BP 103/61 mmHg  Pulse 63  Temp(Src) 98.6 F (37 C) (Oral)  Resp 20  Ht 5\' 5"  (1.651 m)  Wt 100 lb (45.36 kg)  BMI 16.64 kg/m2  SpO2 100%  LMP 02/18/2015  Signed,  Ladona MowJoe Lenda Baratta, PA-C 5:19 PM     Ladona MowJoe Chantale Leugers, PA-C 03/30/15 1719  Gilda Creasehristopher J Pollina, MD 03/31/15 [phone]

## 2015-03-30 NOTE — ED Notes (Signed)
Pt has a sore throat for the past 3-4 days. This morning woke up and it was covered with white stuff.

## 2015-03-30 NOTE — Discharge Instructions (Signed)
Chelsey Torres, Adult  °Chelsey Torres, also called oral candidiasis, is a fungal infection that develops in the mouth and throat and on the tongue. It causes white patches to form on the mouth and tongue. Chelsey Torres is most common in older adults, but it can occur at any age.  °Many cases of Chelsey Torres are mild, but this infection can also be more serious. Chelsey Torres can be a recurring problem for people who have chronic illnesses or who take medicines that limit the body's ability to fight infection. Because these people have difficulty fighting infections, the fungus that causes Chelsey Torres can spread throughout the body. This can cause life-threatening blood or organ infections. °CAUSES  °Chelsey Torres is usually caused by a yeast called Candida albicans. This fungus is normally present in small amounts in the mouth and on other mucous membranes. It usually causes no harm. However, when conditions are present that allow the fungus to grow uncontrolled, it invades surrounding tissues and becomes an infection. Less often, other Candida species can also lead to Chelsey Torres.  °RISK FACTORS °Chelsey Torres is more likely to develop in the following people: °· People with an impaired ability to fight infection (weakened immune system).   °· Older adults.   °· People with HIV.   °· People with diabetes.   °· People with dry mouth (xerostomia).   °· Pregnant women.   °· People with poor dental care, especially those who have false teeth.   °· People who use antibiotic medicines.   °SIGNS AND SYMPTOMS  °Chelsey Torres can be a mild infection that causes no symptoms. If symptoms develop, they may include:  °· A burning feeling in the mouth and throat. This can occur at the start of a Chelsey Torres infection.   °· White patches that adhere to the mouth and tongue. The tissue around the patches may be red, raw, and painful. If rubbed (during tooth brushing, for example), the patches and the tissue of the mouth may bleed easily.   °· A bad taste in the mouth or difficulty tasting foods.    °· Cottony feeling in the mouth.   °· Pain during eating and swallowing. °DIAGNOSIS  °Your health care provider can usually diagnose Chelsey Torres by looking in your mouth and asking you questions about your health.  °TREATMENT  °Medicines that help prevent the growth of fungi (antifungals) are the standard treatment for Chelsey Torres. These medicines are either applied directly to the affected area (topical) or swallowed (oral). The treatment will depend on the severity of the condition.  °Mild Chelsey Torres °Mild cases of Chelsey Torres may clear up with the use of an antifungal mouth rinse or lozenges. Treatment usually lasts about 14 days.  °Moderate to Severe Chelsey Torres °· More severe Chelsey Torres infections that have spread to the esophagus are treated with an oral antifungal medicine. A topical antifungal medicine may also be used.   °· For some severe infections, a treatment period longer than 14 days may be needed.   °· Oral antifungal medicines are almost never used during pregnancy because the fetus may be harmed. However, if a pregnant woman has a rare, severe Chelsey Torres infection that has spread to her blood, oral antifungal medicines may be used. In this case, the risk of harm to the mother and fetus from the severe Chelsey Torres infection may be greater than the risk posed by the use of antifungal medicines.   °Persistent or Recurrent Chelsey Torres °For cases of Chelsey Torres that do not go away or keep coming back, treatment may involve the following:  °· Treatment may be needed twice as long as the symptoms last.   °· Treatment will   include both oral and topical antifungal medicines.   People with weakened immune systems can take an antifungal medicine on a continuous basis to prevent Chelsey Torres infections.  It is important to treat conditions that make you more likely to get Chelsey Torres, such as diabetes or HIV.  HOME CARE INSTRUCTIONS   Only take over-the-counter or prescription medicine as directed by your health care provider. Talk to your health care  provider about an over-the-counter medicine called gentian violet, which kills bacteria and fungi.   Eat plain, unflavored yogurt as directed by your health care provider. Check the label to make sure the yogurt contains live cultures. This yogurt can help healthy bacteria grow in the mouth that can stop the growth of the fungus that causes Chelsey Torres.   Try these measures to help reduce the discomfort of Chelsey Torres:   Drink cold liquids such as water or iced tea.   Try flavored ice treats or frozen juices.   Eat foods that are easy to swallow, such as gelatin, ice cream, or custard.   If the patches in your mouth are painful, try drinking from a straw.   Rinse your mouth several times a day with a warm saltwater rinse. You can make the saltwater mixture with 1 tsp (6 g) of salt in 8 fl oz (0.2 L) of warm water.   If you wear dentures, remove the dentures before going to bed, brush them vigorously, and soak them in a cleaning solution as directed by your health care provider.   Women who are breastfeeding should clean their nipples with an antifungal medicine as directed by their health care provider. Dry the nipples after breastfeeding. Applying lanolin-containing body lotion may help relieve nipple soreness.  SEEK MEDICAL CARE IF:  Your symptoms are getting worse or are not improving within 7 days of starting treatment.   You have symptoms of spreading infection, such as white patches on the skin outside of the mouth.   You are nursing and you have redness, burning, or pain in the nipples that is not relieved with treatment.  MAKE SURE YOU:  Understand these instructions.  Will watch your condition.  Will get help right away if you are not doing well or get worse. Document Released: 08/18/2004 Document Revised: 09/13/2013 Document Reviewed: 06/26/2013 [facility] Patient Information 2015 [place], [place]. This information is not intended to replace advice given to you by your  health care provider. Make sure you discuss any questions you have with your health care provider.  Pharyngitis Pharyngitis is redness, pain, and swelling (inflammation) of your pharynx.  CAUSES  Pharyngitis is usually caused by infection. Most of the time, these infections are from viruses (viral) and are part of a cold. However, sometimes pharyngitis is caused by bacteria (bacterial). Pharyngitis can also be caused by allergies. Viral pharyngitis may be spread from person to person by coughing, sneezing, and personal items or utensils (cups, forks, spoons, toothbrushes). Bacterial pharyngitis may be spread from person to person by more intimate contact, such as kissing.  SIGNS AND SYMPTOMS  Symptoms of pharyngitis include:   Sore throat.   Tiredness (fatigue).   Low-grade fever.   Headache.  Joint pain and muscle aches.  Skin rashes.  Swollen lymph nodes.  Plaque-like film on throat or tonsils (often seen with bacterial pharyngitis). DIAGNOSIS  Your health care provider will ask you questions about your illness and your symptoms. Your medical history, along with a physical exam, is often all that is needed to diagnose pharyngitis. Sometimes,  a rapid strep test is done. Other lab tests may also be done, depending on the suspected cause.  TREATMENT  Viral pharyngitis will usually get better in 3-4 days without the use of medicine. Bacterial pharyngitis is treated with medicines that kill germs (antibiotics).  HOME CARE INSTRUCTIONS   Drink enough water and fluids to keep your urine clear or pale yellow.   Only take over-the-counter or prescription medicines as directed by your health care provider:   If you are prescribed antibiotics, make sure you finish them even if you start to feel better.   Do not take aspirin.   Get lots of rest.   Gargle with 8 oz of salt water ( tsp of salt per 1 qt of water) as often as every 1-2 hours to soothe your throat.   Throat  lozenges (if you are not at risk for choking) or sprays may be used to soothe your throat. SEEK MEDICAL CARE IF:   You have large, tender lumps in your neck.  You have a rash.  You cough up green, yellow-brown, or bloody spit. SEEK IMMEDIATE MEDICAL CARE IF:   Your neck becomes stiff.  You drool or are unable to swallow liquids.  You vomit or are unable to keep medicines or liquids down.  You have severe pain that does not go away with the use of recommended medicines.  You have trouble breathing (not caused by a stuffy nose). MAKE SURE YOU:   Understand these instructions.  Will watch your condition.  Will get help right away if you are not doing well or get worse. Document Released: 11/23/2005 Document Revised: 09/13/2013 Document Reviewed: 07/31/2013 [facility] Patient Information 2015 [place], [place]. This information is not intended to replace advice given to you by your health care provider. Make sure you discuss any questions you have with your health care provider.   Emergency Department Resource Guide 1) Find a Doctor and Pay Out of Pocket Although you won't have to find out who is covered by your insurance plan, it is a good idea to ask around and get recommendations. You will then need to call the office and see if the doctor you have chosen will accept you as a new patient and what types of options they offer for patients who are self-pay. Some doctors offer discounts or will set up payment plans for their patients who do not have insurance, but you will need to ask so you aren't surprised when you get to your appointment.  2) Contact Your Local Health Department Not all health departments have doctors that can see patients for sick visits, but many do, so it is worth a call to see if yours does. If you don't know where your local health department is, you can check in your phone book. The CDC also has a tool to help you locate your state's health department, and many  state websites also have listings of all of their local health departments.  3) Find a Walk-in Clinic If your illness is not likely to be very severe or complicated, you may want to try a walk in clinic. These are popping up all over the country in pharmacies, drugstores, and shopping centers. They're usually staffed by nurse practitioners or physician assistants that have been trained to treat common illnesses and complaints. They're usually fairly quick and inexpensive. However, if you have serious medical issues or chronic medical problems, these are probably not your best option.  No Primary Care Doctor: - Call Health  Connect at  [phone] - they can help you locate a primary care doctor that  accepts your insurance, provides certain services, etc. - Physician Referral Service- [phone]  Chronic Pain Problems: Organization         Address  Phone   Notes  [facility]  [phone] Patients need to be referred by their primary care doctor.   Medication Assistance: Organization         Address  Phone   Notes  [facility] Medication [facility] [address] [place] [place], [place] [address] [phone] --Must be a resident of [facility] -- Must have NO insurance coverage whatsoever (no Medicaid/ Medicare, etc.) -- The pt. MUST have a primary care doctor that directs their care regularly and follows them in the community   [facility]  [phone]   [employer]  [phone]    Agencies that provide inexpensive medical care: Organization         Address  Phone   Notes  [facility]  [phone]   [facility]    [phone]   [facility] [address] [place], [place] [address] [phone]   [facility] [address] [place], [place] [phone]   Planned Parenthood    [phone]   [facility]    [phone]   Community Health and  [facility]  [address], [place] Phone:  [phone], Fax:  [phone] Hours of Operation:  9 am - 6 pm, M-F.  Also accepts Medicaid/Medicare and self-pay.  [facility]  [address], [place] Phone: [phone], Fax: [phone]. Hours of Operation:  8:30 am - 5:30 pm, M-F.  Also accepts Medicaid and self-pay.  [facility] [place] [address] [place] Phone: [phone]   [facility] [address] Natasha Bence [place], [place] [phone], Ext. [phone] Mondays & Thursdays: 7-9 AM.  First 15 patients are seen on a first come, first serve basis.    Medicaid-accepting [facility] Providers:  Organization         Address  Phone   Notes  [facility] [address], [place] [phone] Also accepts self-pay patients.  [facility] [address] Laurell Josephs [place], [place]  [phone]   [facility] [address], [place] [phone]   [facility] [address], [place] [phone]   Renaye Rakers [address], [place]   [phone] Only accepts [place] Access [place] patients after they have their name applied to their card.   Self-Pay (no insurance) in [facility]:  Organization         Address  Phone   Notes  Sickle Cell Patients, [facility] [address] [place], [place] [phone]   [facility] Urgent Care [address] [place], [place] [phone]   [facility]  [address], [place] [phone]   [facility]/Dr. Osei-Bonsu  [address] [place] or [address], [place] [phone] Phone number for both [place] and [place] locations is the same.  Urgent Medical and [facility] [address] [place] Otto [phone]   [facility] [phone]  High  [address], [place] or [address] [phone]) [phone]) [phone]   [facility] [address], [place] [phone](336) [phone], phone; [phone], fax Sees patients 1st and 3rd Saturday of every month.  Must not qualify for public or private insurance (i.e. Medicaid, Medicare, [place] Health Choice, Veterans' Benefits)  Household income should be no more than 200% of the poverty level The clinic cannot treat you if you are pregnant or think you are pregnant  Sexually transmitted diseases are not treated at the clinic.    Dental Care: Organization         Address  Phone  Notes  [facility] [address], [place] [phone] Accepts children up to age 31 who are enrolled in [place] or [place] Health Choice; pregnant women with a Medicaid card; and children who have applied for Medicaid or [place] Health Choice, but were declined, whose parents can pay a reduced fee at time of service.  [facility] [place]  [address], [place] [phone]) [phone] Accepts children up to age 31 who are enrolled in [place] or [place] Health Choice; pregnant women with a Medicaid card; and children who have applied for Medicaid or [place] Health Choice, but were declined, whose parents can pay a reduced fee at time of service.  [facility]  [address], [place] [phone]) [phone] Patients are seen by appointment only. Walk-ins are not accepted. [facility] will see patients 31 years of age and older. Monday - Tuesday (8am-5pm) Most Wednesdays (8:30-5pm) $30 per visit, cash only  [facility]  [address], [facility] [place] [phone]) [phone] Patients are seen by appointment only. Walk-ins are not accepted. [facility] will see patients 31 years of age and older. One Wednesday Evening (Monthly: Volunteer Based).  $30 per visit, cash only  [facility]  [phone] for adults; Children under age 584, call Graduate Pediatric Dentistry at [phone]. Children aged 424-14, please call [phone] to request a pediatric application.  Dental services are provided in all areas of dental care including fillings, crowns and bridges, complete and partial dentures, implants, gum treatment, root canals, and extractions. Preventive care is also provided. Treatment is provided to both adults and children. Patients are selected via a lottery and there is often a waiting list.   [facility] [address], [place]  [phone]) [phone] [email or web address]   [facility] [address], [place], [place] [phone], Ext. [phone] Second and Fourth Thursday of each month, opens at 6:30 AM; Clinic ends at 9 AM.  Patients are seen on a first-come first-served basis, and a limited number are seen during each clinic.   [facility]  [address], [place], [place] [phone]) [phone]   Eligibility Requirements You must have lived in [place], [place], or [place] counties for at least the last three months.   You cannot be eligible for state or federal sponsored [place]healthcare insurance, including CIGNAVeterans Administration, [place]icaid, or [employer]icare.   You generally cannot be eligible for healthcare insurance through your employer.    How to apply: Eligibility screenings are held every Tuesday and Wednesday afternoon from 1:00 pm until 4:00 pm. You do not need an appointment for the interview!  [facility] [address], [place], [place] [phone]   [facility]  [phone]  [facility] Health Department  [phone]   [facility] Health Department  [phone]    Behavioral Health Resources in the Community: Intensive Outpatient Programs Organization         Address  Phone  Notes  [facility] [address] [place], [place]  [phone]   [facility] Out[facility] [address] [place], [place] [phone]   [facility] [address], [place], [place]  [phone]   [facility] Mental Health [address]  [place], [place] [phone] or [phone]   Substance Abuse Resources Organization         Address  Phone  Notes  Alcohol and Drug Services  [phone]   [facility]  [phone]   [facility] [place]  [phone]   Floydene Flock  [phone]   Residential & Outpatient Substance Abuse Program  [phone]   Psychological Services Organization         Address  Phone  Notes  [facility] Behavior[facility] Health  [phone]   [facility] Services  [phone]   [facility] Mental Health [address], [place] [phone] or [phone]    Mobile Crisis Teams Organization         Address  Phone  Notes  [facility], Mobile Crisis Care Unit  [phone]   [facility]  [address] [place], [place] [phone]   Doristine Locks [address] [place] [phone]    Self-Help/Support Groups Organization         Address  Phone             Notes  [facility] - variety of support groups  [phone]- I7437963 Call for more information  Narcotics Anonymous (NA), [facility] [address], [employer] [place]  2 meetings at this location   Statistician         Address  Phone  Notes  [facility] [address],    Liborio Negrin Torres [place]  [phone]   [facility]  [address] [place] [address], [place], [place] [phone]   [facility] [address] [place], [place] [phone] Admissions: 8am-3pm M-F  [facility] [address]    [place], [place] [phone]   [facility] [address] [place], [place], [place] [phone]   [facility] [address]   [place], [place] [phone]   [facility] - Intensive Outpatient [address]urell Joseph[address], [place], [place] [phone]   [facility] ([facility] [address] [place]  [place], [place] [phone] or [phone]   [facility] ([facility]) [address], [place], [place] [phone] Accepts Medicaid  [facility] [place] [address],  [place] [phone] Substance Abuse/Addiction Treatment   [facility] Organization         Address  Phone  Notes  [facility]  [phone]   Angie Fava, PhD [address] Ervin Knack [place], [place]   [phone] or [phone]   [facility] Behavioral   [address] [place], [place] [phone]   [facility] [address], [place], [place] [phone] Insurance/Medicaid/sponsorship through [employer] and Families [address]  [place], [place] [phone] [place], [place] [phone]    Dr. Adele Schilder  [phone]   [facility] Dept. 1) [address] [place] 2) [place] 3)  [address], [place] [phone]  [phone]   [place] [phone] or [phone] (After Hours)

## 2015-04-01 LAB — GC/CHLAMYDIA PROBE AMP (~~LOC~~) NOT AT ARMC
CHLAMYDIA, DNA PROBE: NEGATIVE
Neisseria Gonorrhea: NEGATIVE

## 2017-07-28 ENCOUNTER — Encounter (HOSPITAL_COMMUNITY): Payer: Self-pay | Admitting: Emergency Medicine

## 2017-07-28 ENCOUNTER — Ambulatory Visit (HOSPITAL_COMMUNITY)
Admission: EM | Admit: 2017-07-28 | Discharge: 2017-07-28 | Disposition: A | Discharge status: Home / Self Care | Payer: Self-pay

## 2017-07-28 DIAGNOSIS — J020 Streptococcal pharyngitis: Secondary | ICD-10-CM

## 2017-07-28 LAB — POCT RAPID STREP A: Streptococcus, Group A Screen (Direct): POSITIVE — AB

## 2017-07-28 MED ORDER — PENICILLIN V POTASSIUM 500 MG PO TABS: 500.0000 mg | ORAL_TABLET | Freq: Two times a day (BID) | ORAL | 0 refills | Status: DC

## 2017-07-28 NOTE — ED Provider Notes (Signed)
[facility]    CSN: [id number] Arrival date & time: 07/28/17  1191     History   Chief Complaint Chief Complaint  Patient presents with  . Sore Throat    HPI Chelsey Torres is a 31 y.o. female.   Patient presenting with about 1 week of worsening sore throat. States that she has strep throat in the past and this feels similar. Patient denies any congestion, cough. Patient does not believe she has had any fevers.      History reviewed. No pertinent past medical history.  There are no active problems to display for this patient.   History reviewed. No pertinent surgical history.  OB History    No data available       Home Medications    Prior to Admission medications   Medication Sig Start Date End Date Taking? Authorizing Provider  methadone (DOLOPHINE) 10 MG tablet Take 30 mg by mouth daily.   Yes [provider]  amoxicillin-clavulanate (AUGMENTIN) 875-125 MG per tablet One po bid x 10 days 11/11/13   Sciacca, Marissa, PA-C  flunisolide (NASALIDE) 25 MCG/ACT (0.025%) SOLN Place 2 sprays into the nose 2 (two) times daily.    [provider]  guaiFENesin-dextromethorphan (ROBITUSSIN DM) 100-10 MG/5ML syrup Take 5 mLs by mouth every 4 (four) hours as needed for cough.    [provider]  ibuprofen (ADVIL,MOTRIN) 800 MG tablet Take 1 tablet (800 mg total) by mouth 3 (three) times daily. 03/30/15   Ladona Mow, PA-C  Multiple Vitamin (MULTIVITAMIN WITH MINERALS) TABS tablet Take 1 tablet by mouth daily.    [provider]  nystatin (MYCOSTATIN) 100000 UNIT/ML suspension Swish, gargle and spit 5 mL by mouth 4 times daily for 14 days. 03/30/15   Ladona Mow, PA-C  penicillin v potassium (VEETID) 500 MG tablet Take 1 tablet (500 mg total) by mouth 2 (two) times daily. 07/28/17   Evva Din, Antionette Poles, MD  vitamin C (ASCORBIC ACID) 500 MG tablet Take 1,000 mg by mouth daily.    [provider]    Family History History  reviewed. No pertinent family history.  Social History Social History  Substance Use Topics  . Smoking status: Current Some Day Smoker    Packs/day: 0.50    Years: 10.00    Types: Cigarettes  . Smokeless tobacco: Never Used  . Alcohol use Yes     Comment: socially     Allergies   Tylenol [acetaminophen]   Review of Systems Review of Systems  Constitutional: Negative for chills and fever.  HENT: Negative for congestion and ear pain.   Eyes: Negative for pain and discharge.  Respiratory: Negative for cough and shortness of breath.   Cardiovascular: Negative for chest pain and leg swelling.  Gastrointestinal: Negative for nausea and vomiting.     Physical Exam Triage Vital Signs ED Triage Vitals  Enc Vitals Group     BP 07/28/17 1009 116/74     Pulse Rate 07/28/17 1009 64     Resp --      Temp 07/28/17 1009 98.4 F (36.9 C)     Temp Source 07/28/17 1009 Oral     SpO2 07/28/17 1009 98 %     Weight --      Height --      Head Circumference --      Peak Flow --      Pain Score 07/28/17 1006 8     Pain Loc --  Pain Edu? --      Excl. in GC? --    No data found.   Updated Vital Signs BP 116/74 (BP Location: Left Arm)   Pulse 64   Temp 98.4 F (36.9 C) (Oral)   LMP 07/07/2017 (Approximate)   SpO2 98%       Physical Exam  Constitutional: She appears well-developed and well-nourished.  HENT:  Head: Normocephalic and atraumatic.  Mouth/Throat: Oropharyngeal exudate present. Tonsils are 3+ on the right. Tonsillar exudate.  Neck: Normal range of motion. Neck supple.  Cardiovascular: Normal rate and regular rhythm.   Pulmonary/Chest: Effort normal and breath sounds normal.  Abdominal: Soft. Bowel sounds are normal.  Musculoskeletal: Normal range of motion.  Lymphadenopathy:    She has cervical adenopathy.  Skin: Skin is warm.  Psychiatric: She has a normal mood and affect.     UC Treatments / Results  Labs (all labs ordered are listed, but only  abnormal results are displayed) Labs Reviewed  POCT RAPID STREP A - Abnormal; Notable for the following:       Result Value   Streptococcus, Group A Screen (Direct) POSITIVE (*)    All other components within normal limits    EKG  EKG Interpretation None       Radiology No results found.  Procedures Procedures (including critical care time)  Medications Ordered in UC Medications - No data to display   Initial Impression / Assessment and Plan / UC Course  I have reviewed the triage vital signs and the nursing notes.  Pertinent labs & imaging results that were available during my care of the patient were reviewed by me and considered in my medical decision making (see chart for details).   Patient positive for strep throat. Provided antibiotics for 10 days. Follow-up as needed  Final Clinical Impressions(s) / UC Diagnoses   Final diagnoses:  Strep throat    New Prescriptions New Prescriptions   PENICILLIN V POTASSIUM (VEETID) 500 MG TABLET    Take 1 tablet (500 mg total) by mouth 2 (two) times daily.      Berton Bon, MD 07/28/17 1106

## 2017-07-28 NOTE — ED Triage Notes (Signed)
Pt reports issues with her allergies that started a week ago that was irritating her throat.  This morning she noticed redness and white patches on her tonsils and swelling in her lymph nodes in her neck.  She denies any fever.

## 2017-07-28 NOTE — Discharge Instructions (Addendum)
Please take antibiotic for 10 days,once in the AM and once in the PM. You should see significant improvement in next 48 hours.

## 2017-12-07 NOTE — L&D Delivery Note (Signed)
   Delivery Note Pt pushed about 20 minutes. At 11:38 PM a viable female was delivered via Vaginal, Spontaneous (Presentation: ; OA ).  APGAR: , ; weight 5 lb 0.1 oz (2270 g).   After 1 minute, the cord was clamped and cut. 40 units of pitocin diluted in 1000cc LR was infused rapidly IV.  The placenta separated spontaneously and delivered via CCT and maternal pushing effort.  It was inspected and appears to be intact with a 3 VC .  Cord pH: 7.258  Baby handed off to NICU team.   Anesthesia:  epidural Episiotomy: None Lacerations: 1st degree;Perineal Suture Repair: 2.0 vicryl Est. Blood Loss (mL): 109  Mom to postpartum.  Baby to NICU.  Chelsey Torres 09/08/2018, 11:56 PM  .

## 2018-05-19 LAB — HEPATITIS C ANTIBODY
HCV RNA (copies/mL): DETECTED
Hepatitis C Ab: POSITIVE

## 2018-05-19 LAB — OB RESULTS CONSOLE HIV ANTIBODY (ROUTINE TESTING): HIV: NONREACTIVE

## 2018-05-20 ENCOUNTER — Other Ambulatory Visit (HOSPITAL_COMMUNITY): Payer: Self-pay | Admitting: Family

## 2018-05-20 ENCOUNTER — Encounter (HOSPITAL_COMMUNITY): Payer: Self-pay

## 2018-05-20 DIAGNOSIS — Z3A13 13 weeks gestation of pregnancy: Secondary | ICD-10-CM

## 2018-05-20 DIAGNOSIS — Z3682 Encounter for antenatal screening for nuchal translucency: Secondary | ICD-10-CM

## 2018-05-25 ENCOUNTER — Encounter (HOSPITAL_COMMUNITY): Payer: Self-pay | Admitting: *Deleted

## 2018-05-27 ENCOUNTER — Ambulatory Visit (HOSPITAL_COMMUNITY): Admission: RE | Admit: 2018-05-27 | Payer: Medicaid Other | Source: Ambulatory Visit

## 2018-05-27 ENCOUNTER — Other Ambulatory Visit (HOSPITAL_COMMUNITY): Payer: Self-pay | Admitting: Family

## 2018-05-27 ENCOUNTER — Other Ambulatory Visit (HOSPITAL_COMMUNITY): Payer: Self-pay | Admitting: *Deleted

## 2018-05-27 ENCOUNTER — Encounter (HOSPITAL_COMMUNITY): Payer: Self-pay

## 2018-05-27 ENCOUNTER — Ambulatory Visit (HOSPITAL_COMMUNITY)
Admission: RE | Admit: 2018-05-27 | Discharge: 2018-05-27 | Disposition: A | Discharge status: Home / Self Care | Payer: Medicaid Other | Source: Ambulatory Visit | Attending: Family | Admitting: Family

## 2018-05-27 DIAGNOSIS — Z362 Encounter for other antenatal screening follow-up: Secondary | ICD-10-CM

## 2018-05-27 DIAGNOSIS — Z369 Encounter for antenatal screening, unspecified: Secondary | ICD-10-CM

## 2018-05-27 DIAGNOSIS — Z3A18 18 weeks gestation of pregnancy: Secondary | ICD-10-CM

## 2018-05-27 DIAGNOSIS — Z363 Encounter for antenatal screening for malformations: Secondary | ICD-10-CM | POA: Diagnosis not present

## 2018-05-27 DIAGNOSIS — Z3A13 13 weeks gestation of pregnancy: Secondary | ICD-10-CM

## 2018-05-27 DIAGNOSIS — Z3682 Encounter for antenatal screening for nuchal translucency: Secondary | ICD-10-CM

## 2018-05-27 DIAGNOSIS — O99322 Drug use complicating pregnancy, second trimester: Secondary | ICD-10-CM

## 2018-05-27 HISTORY — DX: Depression, unspecified: F32.A

## 2018-05-27 HISTORY — DX: Anxiety disorder, unspecified: F41.9

## 2018-05-27 HISTORY — DX: Major depressive disorder, single episode, unspecified: F32.9

## 2018-05-27 HISTORY — DX: Other psychoactive substance abuse, uncomplicated: F19.10

## 2018-06-07 ENCOUNTER — Encounter: Payer: Self-pay | Admitting: Obstetrics and Gynecology

## 2018-06-15 ENCOUNTER — Ambulatory Visit (INDEPENDENT_AMBULATORY_CARE_PROVIDER_SITE_OTHER): Payer: Medicaid Other | Admitting: Obstetrics and Gynecology

## 2018-06-15 ENCOUNTER — Encounter: Payer: Self-pay | Admitting: *Deleted

## 2018-06-15 ENCOUNTER — Encounter: Payer: Self-pay | Admitting: Obstetrics and Gynecology

## 2018-06-15 VITALS — BP 101/65 | HR 78 | Wt 111.5 lb

## 2018-06-15 DIAGNOSIS — O99322 Drug use complicating pregnancy, second trimester: Secondary | ICD-10-CM | POA: Diagnosis not present

## 2018-06-15 DIAGNOSIS — O0992 Supervision of high risk pregnancy, unspecified, second trimester: Secondary | ICD-10-CM | POA: Diagnosis not present

## 2018-06-15 DIAGNOSIS — O99320 Drug use complicating pregnancy, unspecified trimester: Secondary | ICD-10-CM

## 2018-06-15 DIAGNOSIS — Z720 Tobacco use: Secondary | ICD-10-CM

## 2018-06-15 DIAGNOSIS — O98812 Other maternal infectious and parasitic diseases complicating pregnancy, second trimester: Secondary | ICD-10-CM

## 2018-06-15 DIAGNOSIS — A749 Chlamydial infection, unspecified: Secondary | ICD-10-CM | POA: Insufficient documentation

## 2018-06-15 DIAGNOSIS — Z8619 Personal history of other infectious and parasitic diseases: Secondary | ICD-10-CM | POA: Insufficient documentation

## 2018-06-15 DIAGNOSIS — F1910 Other psychoactive substance abuse, uncomplicated: Secondary | ICD-10-CM | POA: Insufficient documentation

## 2018-06-15 DIAGNOSIS — F1120 Opioid dependence, uncomplicated: Secondary | ICD-10-CM | POA: Insufficient documentation

## 2018-06-15 DIAGNOSIS — R87612 Low grade squamous intraepithelial lesion on cytologic smear of cervix (LGSIL): Secondary | ICD-10-CM

## 2018-06-15 MED ORDER — NICOTINE 7 MG/24HR TD PT24: 7.0000 mg | MEDICATED_PATCH | TRANSDERMAL | 0 refills | Status: AC

## 2018-06-15 MED ORDER — PRENATAL VITAMINS 0.8 MG PO TABS: 1.0000 | ORAL_TABLET | Freq: Every day | ORAL | 12 refills | Status: AC

## 2018-06-15 NOTE — Progress Notes (Signed)
Pt states has a lot of gas, taking phazyme.

## 2018-06-15 NOTE — Progress Notes (Signed)
New OB Note  06/15/2018   CC:  Chief Complaint  Patient presents with  . Initial Prenatal Visit    Transfer of Care Patient: yes - from [facility]  History of Present Illness: Chelsey NovasJamie L Shidler is a 32 y.o. G3P0020 at 6675w3d by midtrimester ultrasound being seen today for her first obstetrical visit. Her obstetrical history is significant for smoker and polysubstance use(currently on methadone), HSV, anxiety, and depression.. This was a unplanned pregnancy. Relationship with FOB: none. Patient does intend to breast feed. Patient plans to do Nexplanon for contraception after completion of pregnancy. Pregnancy history fully reviewed.  Her periods were: irregular periods since menstruation   She was using no method when she conceived.  She has Negative signs or symptoms of nausea/vomiting of pregnancy. She has Negative signs or symptoms of miscarriage or preterm labor  Patient reports no complaints.  ROS: A 12-point review of systems was performed and negative, except as stated in the above HPI.  OBGYN History: OB History  Gravida Para Term Preterm AB Living  3 0 0 0 2 0  SAB TAB Ectopic Multiple Live Births  0 2 0 0 0    # Outcome Date GA Lbr Len/2nd Weight Sex Delivery Anes PTL Lv  3 Current           2 TAB           1 TAB             GYN Hx: abnormal pap  Past Medical History: Past Medical History:  Diagnosis Date  . Anxiety   . Depression   . Genital herpes   . Substance abuse [facility](HCC)     Past Surgical History: Past Surgical History:  Procedure Laterality Date  . NO PAST SURGERIES      Family History:  Family History  Problem Relation Age of Onset  . Asthma Brother     Social History:  Social History   Socioeconomic History  . Marital status: Single    Spouse name: Not on file  . Number of children: Not on file  . Years of education: Not on file  . Highest education level: Not on file  Occupational History  . Not on file  Social Needs  . Financial  resource strain: Not on file  . Food insecurity:    Worry: Not on file    Inability: Not on file  . Transportation needs:    Medical: Not on file    Non-medical: Not on file  Tobacco Use  . Smoking status: Current Some Day Smoker    Packs/day: 0.50    Years: 10.00    Pack years: 5.00    Types: Cigarettes  . Smokeless tobacco: Never Used  Substance and Sexual Activity  . Alcohol use: Yes    Comment: socially  . Drug use: Yes    Frequency: 0.2 times per week    Types: Marijuana    Comment: methadone, previous pain pills and heroin  . Sexual activity: Not Currently    Birth control/protection: None  Lifestyle  . Physical activity:    Days per week: Not on file    Minutes per session: Not on file  . Stress: Not on file  Relationships  . Social connections:    Talks on phone: Not on file    Gets together: Not on file    Attends religious service: Not on file    Active member of club or organization: Not on file  Attends meetings of clubs or organizations: Not on file    Relationship status: Not on file  . Intimate partner violence:    Fear of current or ex partner: Not on file    Emotionally abused: Not on file    Physically abused: Not on file    Forced sexual activity: Not on file  Other Topics Concern  . Not on file  Social History Narrative  . Not on file    Allergy: Allergies  Allergen Reactions  . Tylenol [Acetaminophen] Hives    Pt. denies allergy    Current Outpatient Medications:  Current Outpatient Medications:  .  guaiFENesin-dextromethorphan (ROBITUSSIN DM) 100-10 MG/5ML syrup, Take 5 mLs by mouth every 4 (four) hours as needed for cough., Disp: , Rfl:  .  methadone (DOLOPHINE) 10 MG tablet, Take 30 mg by mouth daily., Disp: , Rfl:  .  Prenatal Vit-Fe Fumarate-FA (PRENATAL VITAMIN PO), Take by mouth., Disp: , Rfl:  .  simethicone (MYLICON) 125 MG chewable tablet, Chew 250 mg by mouth every 6 (six) hours as needed for flatulence., Disp: , Rfl:  .   vitamin C (ASCORBIC ACID) 500 MG tablet, Take 1,000 mg by mouth daily., Disp: , Rfl:  .  amoxicillin-clavulanate (AUGMENTIN) 875-125 MG per tablet, One po bid x 10 days (Patient not taking: Reported on 05/27/2018), Disp: 20 tablet, Rfl: 0 .  flunisolide (NASALIDE) 25 MCG/ACT (0.025%) SOLN, Place 2 sprays into the nose 2 (two) times daily., Disp: , Rfl:  .  ibuprofen (ADVIL,MOTRIN) 800 MG tablet, Take 1 tablet (800 mg total) by mouth 3 (three) times daily. (Patient not taking: Reported on 05/27/2018), Disp: 21 tablet, Rfl: 0 .  Multiple Vitamin (MULTIVITAMIN WITH MINERALS) TABS tablet, Take 1 tablet by mouth daily., Disp: , Rfl:  .  penicillin v potassium (VEETID) 500 MG tablet, Take 1 tablet (500 mg total) by mouth 2 (two) times daily. (Patient not taking: Reported on 05/27/2018), Disp: 20 tablet, Rfl: 0  Physical Exam:   BP 101/65   Pulse 78   Wt 50.6 kg (111 lb 8 oz)   LMP 02/20/2018   BMI 19.14 kg/m  Body mass index is 19.14 kg/m. Contractions: Not present Vag. Bleeding: None. Fundal height: 21 FHTs: 138  General appearance: Well nourished, well developed female in no acute distress.  Neck:  Supple, normal appearance, and no thyromegaly  Cardiovascular: regular rate and rhythm Respiratory:  Clear to auscultation bilateral. Normal respiratory effort Abdomen: positive bowel sounds and no masses, hernias; diffusely non tender to palpation, non distended Genitalia:  deferred Neuro/Psych:  Normal mood and affect.  Skin:  Warm and dry.  Lymphatic:  No inguinal lymphadenopathy.  Psych: normal mood and affect   Assessment/Plan: G3P0020 [redacted]w[redacted]d. High risk pregnancy so transferred from [facility].  1. Supervision of high risk pregnancy, antepartum, second trimester Doing well. Reviewed prior prenatal records. Anatomy US incomplete. Needs follow-up; already scheduled.  - Prenatal Multivit-Min-Fe-FA (PRENATAL VITAMINS) 0.8 MG tablet; Take 1 tablet by mouth daily.  Dispense: 30 tablet; Refill:  12 - Genetic Screening  2. 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   3. H/O herpes genitalis Last outbreak a year ago. Will need suppressive therapy.   4. Low grade squamous intraepithelial lesion on cytologic smear of cervix (LGSIL) Needs colposcopy done postpartum  5. Polysubstance abuse (HCC) UDS at health department positive with multiple substances. Patient admits to relapsing about a month ago.    6. Chlamydia infection affecting pregnancy  in second trimester Diagnosed and treated on 05/19/18. Will need TOC.  7. Tobacco Use Current daily smoker. Interested in quitting. Nicotine patches prescribed.   Continue prenatal vitamins. Genetic Screening discussed, NIPS: ordered. Problem list reviewed and updated. The nature of [facility] with multiple MDs and other Advanced Practice Providers was explained to patient; also emphasized that residents, students are part of our team. Routine obstetric precautions reviewed. Return in about 1 month (around 07/13/2018) for ob visit.    Caryl Ada, DO OB Fellow Center for [facility], [facility]

## 2018-06-22 ENCOUNTER — Encounter: Payer: Self-pay | Admitting: *Deleted

## 2018-06-30 ENCOUNTER — Encounter: Payer: Self-pay | Admitting: Family Medicine

## 2018-07-08 ENCOUNTER — Ambulatory Visit (HOSPITAL_COMMUNITY): Admission: RE | Admit: 2018-07-08 | Payer: Medicaid Other | Source: Ambulatory Visit

## 2018-07-12 ENCOUNTER — Ambulatory Visit (HOSPITAL_COMMUNITY)
Admission: RE | Admit: 2018-07-12 | Payer: Medicaid Other | Source: Ambulatory Visit | Attending: Maternal and Fetal Medicine | Admitting: Maternal and Fetal Medicine

## 2018-07-18 ENCOUNTER — Other Ambulatory Visit (HOSPITAL_COMMUNITY)
Admission: RE | Admit: 2018-07-18 | Discharge: 2018-07-18 | Disposition: A | Discharge status: Home / Self Care | Payer: Medicaid Other | Source: Ambulatory Visit | Attending: Family Medicine | Admitting: Family Medicine

## 2018-07-18 ENCOUNTER — Ambulatory Visit (INDEPENDENT_AMBULATORY_CARE_PROVIDER_SITE_OTHER): Payer: Medicaid Other | Admitting: Family Medicine

## 2018-07-18 VITALS — BP 101/65 | HR 79 | Wt 113.7 lb

## 2018-07-18 DIAGNOSIS — F1120 Opioid dependence, uncomplicated: Secondary | ICD-10-CM

## 2018-07-18 DIAGNOSIS — Z3A26 26 weeks gestation of pregnancy: Secondary | ICD-10-CM | POA: Diagnosis not present

## 2018-07-18 DIAGNOSIS — O99322 Drug use complicating pregnancy, second trimester: Secondary | ICD-10-CM

## 2018-07-18 DIAGNOSIS — A749 Chlamydial infection, unspecified: Secondary | ICD-10-CM | POA: Diagnosis not present

## 2018-07-18 DIAGNOSIS — Z8619 Personal history of other infectious and parasitic diseases: Secondary | ICD-10-CM | POA: Diagnosis not present

## 2018-07-18 DIAGNOSIS — F1910 Other psychoactive substance abuse, uncomplicated: Secondary | ICD-10-CM | POA: Diagnosis not present

## 2018-07-18 DIAGNOSIS — O0992 Supervision of high risk pregnancy, unspecified, second trimester: Secondary | ICD-10-CM | POA: Diagnosis present

## 2018-07-18 DIAGNOSIS — Z23 Encounter for immunization: Secondary | ICD-10-CM | POA: Diagnosis not present

## 2018-07-18 DIAGNOSIS — O98812 Other maternal infectious and parasitic diseases complicating pregnancy, second trimester: Secondary | ICD-10-CM | POA: Diagnosis not present

## 2018-07-18 DIAGNOSIS — R87612 Low grade squamous intraepithelial lesion on cytologic smear of cervix (LGSIL): Secondary | ICD-10-CM | POA: Diagnosis not present

## 2018-07-18 NOTE — Addendum Note (Signed)
Addended by: Henrietta DineNEAL, Siah Kannan S on: 07/18/2018 02:07 PM   Modules accepted: Orders

## 2018-07-18 NOTE — Progress Notes (Signed)
   PRENATAL VISIT NOTE  Subjective:  Chelsey Torres is a [age] y.o. G3P0020 at 6520w1d being seen today for ongoing prenatal care.  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  Patient reports no complaints.  Contractions: Not present. Vag. Bleeding: None.  Movement: Present. Denies leaking of fluid.   The following portions of the patient's history were reviewed and updated as appropriate: allergies, current medications, past family history, past medical history, past social history, past surgical history and problem list. Problem list updated.  Objective:   Vitals:   07/18/18 1333  BP: 101/65  Pulse: 79  Weight: 113 lb 11.2 oz (51.6 kg)    Fetal Status: Fetal Heart Rate (bpm): 142   Movement: Present     General:  Alert, oriented and cooperative. Patient is in no acute distress.  Skin: Skin is warm and dry. No rash noted.   Cardiovascular: Normal heart rate noted  Respiratory: Normal respiratory effort, no problems with respiration noted  Abdomen: Soft, gravid, appropriate for gestational age.  Pain/Pressure: Absent     Pelvic: Cervical exam deferred        Extremities: Normal range of motion.  Edema: None  Mental Status: Normal mood and affect. Normal behavior. Normal judgment and thought content.   Assessment and Plan:  Pregnancy: G3P0020 at 7320w1d  1. Supervision of high risk pregnancy, antepartum, second trimester FHT and FH normal - Tdap vaccine greater than or equal to 7yo IM  2. 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 H/O herpes  genitalis Prophylaxis at 36 weeks  4. Polysubstance abuse (HCC)  5. 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 Chlamydia infection affecting pregnancy in second trimester TOC today  Preterm labor symptoms and general obstetric precautions including but not limited to vaginal bleeding, contractions, leaking of fluid and fetal movement were reviewed in detail with the patient. Please refer to After Visit Summary for other counseling recommendations.  Return in about 2 weeks (around 08/01/2018) for HR OB f/u.  Future Appointments  Date Time Provider Department Center  07/19/2018  9:45 AM [facility] US 2 [facility]    Levie HeritageJacob J Stinson, DO

## 2018-07-19 ENCOUNTER — Ambulatory Visit (HOSPITAL_COMMUNITY)
Admission: RE | Admit: 2018-07-19 | Discharge: 2018-07-19 | Disposition: A | Discharge status: Home / Self Care | Payer: Medicaid Other | Source: Ambulatory Visit | Attending: Family | Admitting: Family

## 2018-07-19 ENCOUNTER — Encounter (HOSPITAL_COMMUNITY): Payer: Self-pay

## 2018-07-19 ENCOUNTER — Other Ambulatory Visit (HOSPITAL_COMMUNITY): Payer: Self-pay | Admitting: *Deleted

## 2018-07-19 DIAGNOSIS — Z3A26 26 weeks gestation of pregnancy: Secondary | ICD-10-CM | POA: Insufficient documentation

## 2018-07-19 DIAGNOSIS — Z362 Encounter for other antenatal screening follow-up: Secondary | ICD-10-CM | POA: Diagnosis present

## 2018-07-19 DIAGNOSIS — O99322 Drug use complicating pregnancy, second trimester: Secondary | ICD-10-CM | POA: Diagnosis not present

## 2018-07-19 DIAGNOSIS — O0992 Supervision of high risk pregnancy, unspecified, second trimester: Secondary | ICD-10-CM

## 2018-07-19 DIAGNOSIS — F1120 Opioid dependence, uncomplicated: Secondary | ICD-10-CM

## 2018-07-19 DIAGNOSIS — O99320 Drug use complicating pregnancy, unspecified trimester: Principal | ICD-10-CM

## 2018-07-19 LAB — CERVICOVAGINAL ANCILLARY ONLY
Chlamydia: NEGATIVE
Neisseria Gonorrhea: NEGATIVE

## 2018-07-20 ENCOUNTER — Encounter: Payer: Self-pay | Admitting: General Practice

## 2018-08-02 ENCOUNTER — Other Ambulatory Visit: Payer: Self-pay

## 2018-08-02 DIAGNOSIS — O0992 Supervision of high risk pregnancy, unspecified, second trimester: Secondary | ICD-10-CM

## 2018-08-03 ENCOUNTER — Other Ambulatory Visit: Payer: Medicaid Other

## 2018-08-03 ENCOUNTER — Encounter: Payer: Medicaid Other | Admitting: Obstetrics and Gynecology

## 2018-08-10 ENCOUNTER — Other Ambulatory Visit: Payer: Medicaid Other

## 2018-08-10 DIAGNOSIS — O0992 Supervision of high risk pregnancy, unspecified, second trimester: Secondary | ICD-10-CM

## 2018-08-11 ENCOUNTER — Other Ambulatory Visit: Payer: Medicaid Other

## 2018-08-11 ENCOUNTER — Other Ambulatory Visit: Payer: Self-pay | Admitting: Family Medicine

## 2018-08-11 LAB — CBC
Hematocrit: 35.2 % (ref 34.0–46.6)
Hemoglobin: 12 g/dL (ref 11.1–15.9)
MCH: 30.6 pg (ref 26.6–33.0)
MCHC: 34.1 g/dL (ref 31.5–35.7)
MCV: 90 fL (ref 79–97)
PLATELETS: 280 10*3/uL (ref 150–450)
RBC: 3.92 x10E6/uL (ref 3.77–5.28)
RDW: 12.6 % (ref 12.3–15.4)
WBC: 8.3 10*3/uL (ref 3.4–10.8)

## 2018-08-11 LAB — GLUCOSE TOLERANCE, 2 HOURS W/ 1HR
GLUCOSE, 1 HOUR: 153 mg/dL (ref 65–179)
Glucose, 2 hour: 119 mg/dL (ref 65–152)
Glucose, Fasting: 99 mg/dL — ABNORMAL HIGH (ref 65–91)

## 2018-08-11 LAB — HIV ANTIBODY (ROUTINE TESTING W REFLEX): HIV SCREEN 4TH GENERATION: NONREACTIVE

## 2018-08-11 LAB — RPR: RPR Ser Ql: NONREACTIVE

## 2018-08-11 MED ORDER — GLUCOSE BLOOD VI STRP: ORAL_STRIP | 12 refills | Status: DC

## 2018-08-11 MED ORDER — ACCU-CHEK NANO SMARTVIEW W/DEVICE KIT: 1.0000 | PACK | 0 refills | Status: DC

## 2018-08-11 MED ORDER — ACCU-CHEK FASTCLIX LANCETS MISC: 1.0000 [IU] | Freq: Four times a day (QID) | 12 refills | Status: DC

## 2018-08-12 ENCOUNTER — Telehealth: Payer: Self-pay

## 2018-08-12 NOTE — Telephone Encounter (Signed)
Pt called on Nurse VM on 08/11/18 @ 5:07p stating that she missed our call & that she does not have a VM set up to leave a message. View pt chart & did not show a note stating that we tried to reach her. Pt has an appt on 08/15/18 with Dr. Adrian Blackwater.

## 2018-08-15 ENCOUNTER — Encounter: Payer: Self-pay | Admitting: Family Medicine

## 2018-08-15 ENCOUNTER — Encounter: Payer: Medicaid Other | Admitting: Family Medicine

## 2018-08-16 ENCOUNTER — Ambulatory Visit (HOSPITAL_COMMUNITY)
Admission: RE | Admit: 2018-08-16 | Discharge: 2018-08-16 | Disposition: A | Discharge status: Home / Self Care | Payer: Medicaid Other | Source: Ambulatory Visit | Attending: Family | Admitting: Family

## 2018-08-16 ENCOUNTER — Encounter (HOSPITAL_COMMUNITY): Payer: Self-pay

## 2018-08-16 ENCOUNTER — Other Ambulatory Visit: Payer: Medicaid Other

## 2018-08-19 ENCOUNTER — Ambulatory Visit (HOSPITAL_COMMUNITY): Admission: RE | Admit: 2018-08-19 | Payer: Medicaid Other | Source: Ambulatory Visit

## 2018-08-23 ENCOUNTER — Other Ambulatory Visit: Payer: Medicaid Other

## 2018-08-24 ENCOUNTER — Encounter: Payer: Self-pay | Admitting: Obstetrics & Gynecology

## 2018-08-24 ENCOUNTER — Encounter: Payer: Medicaid Other | Admitting: Obstetrics & Gynecology

## 2018-08-30 ENCOUNTER — Observation Stay (HOSPITAL_COMMUNITY)
Admission: AD | Admit: 2018-08-30 | Discharge: 2018-08-30 | Disposition: A | Discharge status: Home / Self Care | Payer: Medicaid Other | Source: Ambulatory Visit | Attending: Obstetrics and Gynecology | Admitting: Obstetrics and Gynecology

## 2018-08-30 ENCOUNTER — Encounter (HOSPITAL_COMMUNITY): Payer: Self-pay | Admitting: *Deleted

## 2018-08-30 ENCOUNTER — Ambulatory Visit (HOSPITAL_BASED_OUTPATIENT_CLINIC_OR_DEPARTMENT_OTHER)
Admission: RE | Admit: 2018-08-30 | Discharge: 2018-08-30 | Disposition: A | Discharge status: Home / Self Care | Payer: Medicaid Other | Source: Ambulatory Visit | Attending: Obstetrics and Gynecology | Admitting: Obstetrics and Gynecology

## 2018-08-30 ENCOUNTER — Encounter (HOSPITAL_COMMUNITY): Payer: Self-pay

## 2018-08-30 DIAGNOSIS — O99333 Smoking (tobacco) complicating pregnancy, third trimester: Secondary | ICD-10-CM | POA: Insufficient documentation

## 2018-08-30 DIAGNOSIS — F419 Anxiety disorder, unspecified: Secondary | ICD-10-CM | POA: Insufficient documentation

## 2018-08-30 DIAGNOSIS — O4703 False labor before 37 completed weeks of gestation, third trimester: Secondary | ICD-10-CM

## 2018-08-30 DIAGNOSIS — Z3A32 32 weeks gestation of pregnancy: Secondary | ICD-10-CM | POA: Insufficient documentation

## 2018-08-30 DIAGNOSIS — O99323 Drug use complicating pregnancy, third trimester: Secondary | ICD-10-CM | POA: Insufficient documentation

## 2018-08-30 DIAGNOSIS — F17210 Nicotine dependence, cigarettes, uncomplicated: Secondary | ICD-10-CM | POA: Diagnosis not present

## 2018-08-30 DIAGNOSIS — O99320 Drug use complicating pregnancy, unspecified trimester: Principal | ICD-10-CM

## 2018-08-30 DIAGNOSIS — O368390 Maternal care for abnormalities of the fetal heart rate or rhythm, unspecified trimester, not applicable or unspecified: Secondary | ICD-10-CM | POA: Diagnosis present

## 2018-08-30 DIAGNOSIS — O24419 Gestational diabetes mellitus in pregnancy, unspecified control: Secondary | ICD-10-CM | POA: Insufficient documentation

## 2018-08-30 DIAGNOSIS — O368330 Maternal care for abnormalities of the fetal heart rate or rhythm, third trimester, not applicable or unspecified: Secondary | ICD-10-CM

## 2018-08-30 DIAGNOSIS — Z362 Encounter for other antenatal screening follow-up: Secondary | ICD-10-CM | POA: Insufficient documentation

## 2018-08-30 DIAGNOSIS — O99343 Other mental disorders complicating pregnancy, third trimester: Secondary | ICD-10-CM | POA: Diagnosis not present

## 2018-08-30 DIAGNOSIS — F329 Major depressive disorder, single episode, unspecified: Secondary | ICD-10-CM | POA: Diagnosis not present

## 2018-08-30 DIAGNOSIS — R768 Other specified abnormal immunological findings in serum: Secondary | ICD-10-CM | POA: Insufficient documentation

## 2018-08-30 DIAGNOSIS — Z3689 Encounter for other specified antenatal screening: Secondary | ICD-10-CM

## 2018-08-30 DIAGNOSIS — Z886 Allergy status to analgesic agent status: Secondary | ICD-10-CM | POA: Diagnosis not present

## 2018-08-30 DIAGNOSIS — O99322 Drug use complicating pregnancy, second trimester: Secondary | ICD-10-CM

## 2018-08-30 DIAGNOSIS — F1120 Opioid dependence, uncomplicated: Secondary | ICD-10-CM

## 2018-08-30 DIAGNOSIS — O9989 Other specified diseases and conditions complicating pregnancy, childbirth and the puerperium: Secondary | ICD-10-CM | POA: Diagnosis present

## 2018-08-30 DIAGNOSIS — F1190 Opioid use, unspecified, uncomplicated: Secondary | ICD-10-CM | POA: Insufficient documentation

## 2018-08-30 DIAGNOSIS — O0992 Supervision of high risk pregnancy, unspecified, second trimester: Secondary | ICD-10-CM

## 2018-08-30 LAB — RAPID URINE DRUG SCREEN, HOSP PERFORMED
Amphetamines: NOT DETECTED
BARBITURATES: NOT DETECTED
Benzodiazepines: NOT DETECTED
COCAINE: NOT DETECTED
Opiates: NOT DETECTED
TETRAHYDROCANNABINOL: NOT DETECTED

## 2018-08-30 LAB — URINALYSIS, ROUTINE W REFLEX MICROSCOPIC
BACTERIA UA: NONE SEEN
BILIRUBIN URINE: NEGATIVE
GLUCOSE, UA: NEGATIVE mg/dL
HGB URINE DIPSTICK: NEGATIVE
KETONES UR: NEGATIVE mg/dL
NITRITE: NEGATIVE
PH: 7 (ref 5.0–8.0)
PROTEIN: NEGATIVE mg/dL
Specific Gravity, Urine: 1.001 — ABNORMAL LOW (ref 1.005–1.030)

## 2018-08-30 LAB — CBC
HCT: 35.2 % — ABNORMAL LOW (ref 36.0–46.0)
HEMOGLOBIN: 11.4 g/dL — AB (ref 12.0–15.0)
MCH: 29.5 pg (ref 26.0–34.0)
MCHC: 32.4 g/dL (ref 30.0–36.0)
MCV: 91 fL (ref 78.0–100.0)
Platelets: 245 10*3/uL (ref 150–400)
RBC: 3.87 MIL/uL (ref 3.87–5.11)
RDW: 13 % (ref 11.5–15.5)
WBC: 7.1 10*3/uL (ref 4.0–10.5)

## 2018-08-30 LAB — TYPE AND SCREEN
ABO/RH(D): A POS
ANTIBODY SCREEN: NEGATIVE

## 2018-08-30 LAB — ABO/RH: ABO/RH(D): A POS

## 2018-08-30 LAB — FETAL FIBRONECTIN: FETAL FIBRONECTIN: NEGATIVE

## 2018-08-30 MED ORDER — NIFEDIPINE 10 MG PO CAPS
10.0000 mg | ORAL_CAPSULE | ORAL | Status: AC
  Administered 2018-08-30 (×3): 10 mg via ORAL
  Filled 2018-08-30 (×3): qty 1

## 2018-08-30 MED ORDER — LACTATED RINGERS IV BOLUS
1000.0000 mL | Freq: Once | INTRAVENOUS | Status: AC
  Administered 2018-08-30: 1000 mL via INTRAVENOUS

## 2018-08-30 MED ORDER — TERBUTALINE SULFATE 1 MG/ML IJ SOLN: 0.2500 mg | Freq: Once | INTRAMUSCULAR | Status: DC

## 2018-08-30 MED ORDER — METHADONE HCL 10 MG PO TABS: 40.0000 mg | ORAL_TABLET | Freq: Every day | ORAL | Status: DC

## 2018-08-30 MED ORDER — PRENATAL MULTIVITAMIN CH: 1.0000 | ORAL_TABLET | Freq: Every day | ORAL | Status: DC

## 2018-08-30 MED ORDER — BETAMETHASONE SOD PHOS & ACET 6 (3-3) MG/ML IJ SUSP
12.0000 mg | INTRAMUSCULAR | Status: DC
  Administered 2018-08-30: 12 mg via INTRAMUSCULAR
  Filled 2018-08-30: qty 2

## 2018-08-30 MED ORDER — ZOLPIDEM TARTRATE 5 MG PO TABS: 5.0000 mg | ORAL_TABLET | Freq: Every evening | ORAL | Status: DC | PRN

## 2018-08-30 MED ORDER — LACTATED RINGERS IV SOLN
INTRAVENOUS | Status: DC
  Administered 2018-08-30: 12:00:00 via INTRAVENOUS

## 2018-08-30 MED ORDER — CALCIUM CARBONATE ANTACID 500 MG PO CHEW: 2.0000 | CHEWABLE_TABLET | ORAL | Status: DC | PRN

## 2018-08-30 NOTE — MAU Note (Signed)
Brought from MFM,FH in 50's during [place]

## 2018-08-30 NOTE — Discharge Instructions (Signed)
Braxton Hicks Contractions °Contractions of the uterus can occur throughout pregnancy, but they are not always a sign that you are in labor. You may have practice contractions called Braxton Hicks contractions. These false labor contractions are sometimes confused with true labor. °What are Braxton Hicks contractions? °Braxton Hicks contractions are tightening movements that occur in the muscles of the uterus before labor. Unlike true labor contractions, these contractions do not result in opening (dilation) and thinning of the cervix. Toward the end of pregnancy (32-34 weeks), Braxton Hicks contractions can happen more often and may become stronger. These contractions are sometimes difficult to tell apart from true labor because they can be very uncomfortable. You should not feel embarrassed if you go to the hospital with false labor. °Sometimes, the only way to tell if you are in true labor is for your health care provider to look for changes in the cervix. The health care provider will do a physical exam and may monitor your contractions. If you are not in true labor, the exam should show that your cervix is not dilating and your water has not broken. °If there are other health problems associated with your pregnancy, it is completely safe for you to be sent home with false labor. You may continue to have Braxton Hicks contractions until you go into true labor. °How to tell the difference between true labor and false labor °True labor °· Contractions last 30-70 seconds. °· Contractions become very regular. °· Discomfort is usually felt in the top of the uterus, and it spreads to the lower abdomen and low back. °· Contractions do not go away with walking. °· Contractions usually become more intense and increase in frequency. °· The cervix dilates and gets thinner. °False labor °· Contractions are usually shorter and not as strong as true labor contractions. °· Contractions are usually irregular. °· Contractions  are often felt in the front of the lower abdomen and in the groin. °· Contractions may go away when you walk around or change positions while lying down. °· Contractions get weaker and are shorter-lasting as time goes on. °· The cervix usually does not dilate or become thin. °Follow these instructions at home: °· Take over-the-counter and prescription medicines only as told by your health care provider. °· Keep up with your usual exercises and follow other instructions from your health care provider. °· Eat and drink lightly if you think you are going into labor. °· If Braxton Hicks contractions are making you uncomfortable: °? Change your position from lying down or resting to walking, or change from walking to resting. °? Sit and rest in a tub of warm water. °? Drink enough fluid to keep your urine pale yellow. Dehydration may cause these contractions. °? Do slow and deep breathing several times an hour. °· Keep all follow-up prenatal visits as told by your health care provider. This is important. °Contact a health care provider if: °· You have a fever. °· You have continuous pain in your abdomen. °Get help right away if: °· Your contractions become stronger, more regular, and closer together. °· You have fluid leaking or gushing from your vagina. °· You pass blood-tinged mucus (bloody show). °· You have bleeding from your vagina. °· You have low back pain that you never had before. °· You feel your baby’s head pushing down and causing pelvic pressure. °· Your baby is not moving inside you as much as it used to. °Summary °· Contractions that occur before labor are called Braxton   Hicks contractions, false labor, or practice contractions. °· Braxton Hicks contractions are usually shorter, weaker, farther apart, and less regular than true labor contractions. True labor contractions usually become progressively stronger and regular and they become more frequent. °· Manage discomfort from Braxton Hicks contractions by  changing position, resting in a warm bath, drinking plenty of water, or practicing deep breathing. °This information is not intended to replace advice given to you by your health care provider. Make sure you discuss any questions you have with your health care provider. °Document Released: 04/08/2017 Document Revised: 04/08/2017 Document Reviewed: 04/08/2017 °Elsevier Interactive Patient Education © 2018 [employer] ° °

## 2018-08-30 NOTE — MAU Note (Signed)
Pt brought down from MFM with fetal heartrate in the 50's. EFM applied with FHR in 130's upon arrival.

## 2018-08-30 NOTE — MAU Provider Note (Addendum)
History     CSN: [id number]  Arrival date and time: 08/30/18 1026   First Provider Initiated Contact with Patient 08/30/18 1031      Chief Complaint  Patient presents with  . fetal distress   G3P0020 '@32' .2 wks sent from MFM for fetal bradycardia seen on [place]. Reports good FM. No VB or LOF, or ctx. Been having abdominal discomfort for some time, thought it was gas, unsure if it was ctx. Denies drug use, taking Methadone, had it this am. Eating and drinking well. Her pregnancy is complicated by Methadone use, Chlamydia, hx HSV, hx polysubstance abuse.   OB History    Gravida  3   Para  0   Term  0   Preterm  0   AB  2   Living  0     SAB  0   TAB  2   Ectopic  0   Multiple  0   Live Births  0           Past Medical History:  Diagnosis Date  . Anxiety   . Depression   . Genital herpes   . Substance abuse [facility])     Past Surgical History:  Procedure Laterality Date  . NO PAST SURGERIES      Family History  Problem Relation Age of Onset  . Asthma Brother     Social History   Tobacco Use  . Smoking status: Current Some Day Smoker    Packs/day: 0.50    Years: 10.00    Pack years: 5.00    Types: Cigarettes  . Smokeless tobacco: Never Used  Substance Use Topics  . Alcohol use: Yes    Comment: socially  . Drug use: Yes    Frequency: 0.2 times per week    Types: Marijuana    Comment: methadone, previous pain pills and heroin    Allergies:  Allergies  Allergen Reactions  . Tylenol [Acetaminophen] Hives    Pt. denies allergy    Medications Prior to Admission  Medication Sig Dispense Refill Last Dose  . ACCU-CHEK FASTCLIX LANCETS MISC 1 Units by Percutaneous route 4 (four) times daily. 100 each 12   . Blood Glucose Monitoring Suppl (ACCU-CHEK NANO SMARTVIEW) w/Device KIT 1 kit by Subdermal route as directed. Check blood sugars for fasting, and two hours after breakfast, lunch and dinner (4 checks daily) 1 kit 0   . flunisolide (NASALIDE) 25  MCG/ACT (0.025%) SOLN Place 2 sprays into the nose 2 (two) times daily.   Taking  . glucose blood (ACCU-CHEK SMARTVIEW) test strip Use as instructed to check blood sugars 100 each 12   . methadone (DOLOPHINE) 10 MG tablet Take 40 mg by mouth daily.   Taking  . Multiple Vitamin (MULTIVITAMIN WITH MINERALS) TABS tablet Take 1 tablet by mouth daily.   Not Taking  . Prenatal Multivit-Min-Fe-FA (PRENATAL VITAMINS) 0.8 MG tablet Take 1 tablet by mouth daily. 30 tablet 12 Taking  . Prenatal Vit-Fe Fumarate-FA (PRENATAL VITAMIN PO) Take by mouth.   Not Taking  . simethicone (MYLICON) 409 MG chewable tablet Chew 250 mg by mouth every 6 (six) hours as needed for flatulence.   Taking  . vitamin C (ASCORBIC ACID) 500 MG tablet Take 1,000 mg by mouth daily.   Taking    Review of Systems  Gastrointestinal: Negative for abdominal pain.  Genitourinary: Negative for vaginal bleeding and vaginal discharge.   Physical Exam   Blood pressure 105/68, pulse (!) 47, temperature 98.4 F (  36.9 C), resp. rate 18, last menstrual period 02/20/2018, SpO2 100 %.  Physical Exam  Constitutional: She is oriented to person, place, and time. She appears well-developed and well-nourished. No distress.  HENT:  Head: Normocephalic and atraumatic.  Neck: Normal range of motion.  Cardiovascular: Normal rate.  Respiratory: Effort normal. No respiratory distress.  GI: Soft. She exhibits no distension. There is no tenderness.  gravid  Genitourinary:  Genitourinary Comments: SVE 1/70/-2, vtx  Musculoskeletal: Normal range of motion.  Neurological: She is alert and oriented to person, place, and time.  Skin: Skin is warm and dry.  Psychiatric: She has a normal mood and affect.  EFM: 135 bpm, mod variability, + accels, no decels Toco: 1-2  Results for orders placed or performed during the hospital encounter of 08/30/18 (from the past 24 hour(s))  Fetal fibronectin     Status: None   Collection Time: 08/30/18 10:45 AM   Result Value Ref Range   Fetal Fibronectin NEGATIVE NEGATIVE  CBC     Status: Abnormal   Collection Time: 08/30/18 10:54 AM  Result Value Ref Range   WBC 7.1 4.0 - 10.5 K/uL   RBC 3.87 3.87 - 5.11 MIL/uL   Hemoglobin 11.4 (L) 12.0 - 15.0 g/dL   HCT 35.2 (L) 36.0 - 46.0 %   MCV 91.0 78.0 - 100.0 fL   MCH 29.5 26.0 - 34.0 pg   MCHC 32.4 30.0 - 36.0 g/dL   RDW 13.0 11.5 - 15.5 %   Platelets 245 150 - 400 K/uL  Type and screen     Status: None   Collection Time: 08/30/18 10:54 AM  Result Value Ref Range   ABO/RH(D) A POS    Antibody Screen NEG    Sample Expiration      09/02/2018 Performed at [facility], [address] [place], [place] [address]   ABO/Rh     Status: None   Collection Time: 08/30/18 10:54 AM  Result Value Ref Range   ABO/RH(D)      A POS Performed at [facility], [address], [place],  [address]   Urine rapid drug screen (hosp performed)     Status: None   Collection Time: 08/30/18 11:59 AM  Result Value Ref Range   Opiates NONE DETECTED NONE DETECTED   Cocaine NONE DETECTED NONE DETECTED   Benzodiazepines NONE DETECTED NONE DETECTED   Amphetamines NONE DETECTED NONE DETECTED   Tetrahydrocannabinol NONE DETECTED NONE DETECTED   Barbiturates NONE DETECTED NONE DETECTED  Urinalysis, Routine w reflex microscopic     Status: Abnormal   Collection Time: 08/30/18 11:59 AM  Result Value Ref Range   Color, Urine STRAW (A) YELLOW   APPearance CLEAR CLEAR   Specific Gravity, Urine 1.001 (L) 1.005 - 1.030   pH 7.0 5.0 - 8.0   Glucose, UA NEGATIVE NEGATIVE mg/dL   Hgb urine dipstick NEGATIVE NEGATIVE   Bilirubin Urine NEGATIVE NEGATIVE   Ketones, ur NEGATIVE NEGATIVE mg/dL   Protein, ur NEGATIVE NEGATIVE mg/dL   Nitrite NEGATIVE NEGATIVE   Leukocytes, UA TRACE (A) NEGATIVE   RBC / HPF 0-5 0 - 5 RBC/hpf   WBC, UA 0-5 0 - 5 WBC/hpf   Bacteria, UA NONE SEEN NONE SEEN   MAU Course  Procedures LR Procardia x3  MDM Labs ordered and  reviewed. NST reactive, no decels or bradycardia. Ctx mostly resolved after Procardia, now only irritability, cervix unchanged, will send back to MFM to complete US and BPP. BPP 8/8, AFI 23.  Continues to have ctx q3-5, not feeling them. Consult with Dr. Ilda Basset, MD at bedside to discuss plan. Plan for [place] now and discharge home.  Assessment and Plan   1. [redacted] weeks gestation of pregnancy   2. Fetal heart rate decelerations affecting management of mother   3. Supervision of high risk pregnancy, antepartum, second trimester   4. NST (non-stress test) reactive   5. Preterm uterine contractions in third trimester, antepartum    Discharge home Follow up at [facility] tomorrow for [place] #2 PTL precautions  Allergies as of 08/30/2018      Reactions   Tylenol [acetaminophen] Hives   Pt. denies allergy      Medication List    STOP taking these medications   multivitamin with minerals Tabs tablet   PRENATAL VITAMIN PO     TAKE these medications   ACCU-CHEK FASTCLIX LANCETS Misc 1 Units by Percutaneous route 4 (four) times daily.   ACCU-CHEK NANO SMARTVIEW w/Device Kit 1 kit by Subdermal route as directed. Check blood sugars for fasting, and two hours after breakfast, lunch and dinner (4 checks daily)   flunisolide 25 MCG/ACT (0.025%) Soln Commonly known as:  NASALIDE Place 2 sprays into the nose 2 (two) times daily.   glucose blood test strip Use as instructed to check blood sugars   methadone 10 MG tablet Commonly known as:  DOLOPHINE Take 40 mg by mouth daily.   Prenatal Vitamins 0.8 MG tablet Take 1 tablet by mouth daily.   simethicone 125 MG chewable tablet Commonly known as:  MYLICON Chew 784 mg by mouth every 6 (six) hours as needed for flatulence.   vitamin C 500 MG tablet Commonly known as:  ASCORBIC ACID Take 1,000 mg by mouth daily.      Julianne Handler, CNM 08/30/2018, 4:46 PM

## 2018-08-31 ENCOUNTER — Ambulatory Visit (INDEPENDENT_AMBULATORY_CARE_PROVIDER_SITE_OTHER): Payer: Medicaid Other | Admitting: *Deleted

## 2018-08-31 MED ORDER — BETAMETHASONE SOD PHOS & ACET 6 (3-3) MG/ML IJ SUSP
12.0000 mg | Freq: Once | INTRAMUSCULAR | Status: AC
  Administered 2018-08-31: 12 mg via INTRAMUSCULAR

## 2018-09-01 ENCOUNTER — Encounter: Payer: Self-pay | Admitting: *Deleted

## 2018-09-06 ENCOUNTER — Encounter: Payer: Medicaid Other | Admitting: Obstetrics and Gynecology

## 2018-09-06 ENCOUNTER — Telehealth: Payer: Self-pay | Admitting: *Deleted

## 2018-09-06 NOTE — Telephone Encounter (Signed)
Received fax re: clarifying how often to use test strips .Called [facility] and left message with instructions use 4 times per day,.

## 2018-09-07 ENCOUNTER — Encounter (HOSPITAL_COMMUNITY): Payer: Self-pay | Admitting: *Deleted

## 2018-09-07 ENCOUNTER — Inpatient Hospital Stay (HOSPITAL_COMMUNITY)
Admission: AD | Admit: 2018-09-07 | Discharge: 2018-09-10 | DRG: 806 | Disposition: A | Discharge status: Home / Self Care | Payer: Medicaid Other | Attending: Obstetrics & Gynecology | Admitting: Obstetrics & Gynecology

## 2018-09-07 DIAGNOSIS — F1120 Opioid dependence, uncomplicated: Secondary | ICD-10-CM

## 2018-09-07 DIAGNOSIS — B1920 Unspecified viral hepatitis C without hepatic coma: Secondary | ICD-10-CM | POA: Diagnosis present

## 2018-09-07 DIAGNOSIS — O99334 Smoking (tobacco) complicating childbirth: Secondary | ICD-10-CM | POA: Diagnosis present

## 2018-09-07 DIAGNOSIS — Z8619 Personal history of other infectious and parasitic diseases: Secondary | ICD-10-CM

## 2018-09-07 DIAGNOSIS — O9842 Viral hepatitis complicating childbirth: Secondary | ICD-10-CM | POA: Diagnosis present

## 2018-09-07 DIAGNOSIS — Z23 Encounter for immunization: Secondary | ICD-10-CM

## 2018-09-07 DIAGNOSIS — O24420 Gestational diabetes mellitus in childbirth, diet controlled: Principal | ICD-10-CM | POA: Diagnosis present

## 2018-09-07 DIAGNOSIS — O4700 False labor before 37 completed weeks of gestation, unspecified trimester: Secondary | ICD-10-CM | POA: Diagnosis present

## 2018-09-07 DIAGNOSIS — F17210 Nicotine dependence, cigarettes, uncomplicated: Secondary | ICD-10-CM | POA: Diagnosis present

## 2018-09-07 DIAGNOSIS — Z8614 Personal history of Methicillin resistant Staphylococcus aureus infection: Secondary | ICD-10-CM

## 2018-09-07 DIAGNOSIS — O99324 Drug use complicating childbirth: Secondary | ICD-10-CM | POA: Diagnosis present

## 2018-09-07 DIAGNOSIS — O24419 Gestational diabetes mellitus in pregnancy, unspecified control: Secondary | ICD-10-CM | POA: Diagnosis present

## 2018-09-07 DIAGNOSIS — R768 Other specified abnormal immunological findings in serum: Secondary | ICD-10-CM

## 2018-09-07 DIAGNOSIS — O4703 False labor before 37 completed weeks of gestation, third trimester: Secondary | ICD-10-CM

## 2018-09-07 DIAGNOSIS — Z3A33 33 weeks gestation of pregnancy: Secondary | ICD-10-CM

## 2018-09-07 DIAGNOSIS — F1110 Opioid abuse, uncomplicated: Secondary | ICD-10-CM | POA: Diagnosis present

## 2018-09-07 DIAGNOSIS — O99323 Drug use complicating pregnancy, third trimester: Secondary | ICD-10-CM

## 2018-09-07 DIAGNOSIS — O99320 Drug use complicating pregnancy, unspecified trimester: Secondary | ICD-10-CM

## 2018-09-07 LAB — URINALYSIS, ROUTINE W REFLEX MICROSCOPIC
Bilirubin Urine: NEGATIVE
Glucose, UA: NEGATIVE mg/dL
Ketones, ur: NEGATIVE mg/dL
Nitrite: NEGATIVE
PH: 7 (ref 5.0–8.0)
Protein, ur: NEGATIVE mg/dL
SPECIFIC GRAVITY, URINE: 1.004 — AB (ref 1.005–1.030)

## 2018-09-07 LAB — TYPE AND SCREEN
ABO/RH(D): A POS
Antibody Screen: NEGATIVE

## 2018-09-07 LAB — CBC
HEMATOCRIT: 37.1 % (ref 36.0–46.0)
HEMOGLOBIN: 12.6 g/dL (ref 12.0–15.0)
MCH: 30.3 pg (ref 26.0–34.0)
MCHC: 34 g/dL (ref 30.0–36.0)
MCV: 89.2 fL (ref 78.0–100.0)
PLATELETS: 323 10*3/uL (ref 150–400)
RBC: 4.16 MIL/uL (ref 3.87–5.11)
RDW: 12.5 % (ref 11.5–15.5)
WBC: 12.6 10*3/uL — AB (ref 4.0–10.5)

## 2018-09-07 LAB — GLUCOSE, CAPILLARY
Glucose-Capillary: 100 mg/dL — ABNORMAL HIGH (ref 70–99)
Glucose-Capillary: 93 mg/dL (ref 70–99)

## 2018-09-07 LAB — CREATININE, SERUM
Creatinine, Ser: 0.61 mg/dL (ref 0.44–1.00)
GFR calc Af Amer: >60 mL/min (ref >60)
GFR calc non Af Amer: >60 mL/min (ref >60)

## 2018-09-07 LAB — RPR: RPR Ser Ql: NONREACTIVE

## 2018-09-07 MED ORDER — METHADONE HCL 10 MG PO TABS
40.0000 mg | ORAL_TABLET | Freq: Every day | ORAL | Status: DC
  Filled 2018-09-07: qty 4

## 2018-09-07 MED ORDER — DIPHENHYDRAMINE HCL 25 MG PO CAPS
25.0000 mg | ORAL_CAPSULE | Freq: Once | ORAL | Status: AC
  Administered 2018-09-07: 25 mg via ORAL
  Filled 2018-09-07: qty 1

## 2018-09-07 MED ORDER — ENOXAPARIN SODIUM 40 MG/0.4ML ~~LOC~~ SOLN
40.0000 mg | SUBCUTANEOUS | Status: DC
  Administered 2018-09-07: 40 mg via SUBCUTANEOUS
  Filled 2018-09-07 (×2): qty 0.4

## 2018-09-07 MED ORDER — OXYTOCIN 40 UNITS IN LACTATED RINGERS INFUSION - SIMPLE MED: 2.5000 [IU]/h | INTRAVENOUS | Status: DC

## 2018-09-07 MED ORDER — PENICILLIN G 3 MILLION UNITS IVPB - SIMPLE MED: 3.0000 10*6.[IU] | INTRAVENOUS | Status: DC

## 2018-09-07 MED ORDER — ONDANSETRON HCL 4 MG/2ML IJ SOLN: 4.0000 mg | Freq: Four times a day (QID) | INTRAMUSCULAR | Status: DC | PRN

## 2018-09-07 MED ORDER — MAGNESIUM SULFATE 40 G IN LACTATED RINGERS - SIMPLE
2.0000 g/h | INTRAVENOUS | Status: DC
  Administered 2018-09-08: 2 g/h via INTRAVENOUS
  Filled 2018-09-07 (×2): qty 500

## 2018-09-07 MED ORDER — ZOLPIDEM TARTRATE 5 MG PO TABS
5.0000 mg | ORAL_TABLET | Freq: Every evening | ORAL | Status: DC | PRN
  Administered 2018-09-07: 5 mg via ORAL
  Filled 2018-09-07: qty 1

## 2018-09-07 MED ORDER — HYDROMORPHONE HCL 1 MG/ML IJ SOLN
INTRAMUSCULAR | Status: AC
  Filled 2018-09-07: qty 1

## 2018-09-07 MED ORDER — FLEET ENEMA 7-19 GM/118ML RE ENEM: 1.0000 | ENEMA | RECTAL | Status: DC | PRN

## 2018-09-07 MED ORDER — PENICILLIN G 3 MILLION UNITS IVPB - SIMPLE MED
3.0000 10*6.[IU] | INTRAVENOUS | Status: DC
  Administered 2018-09-07: 3 10*6.[IU] via INTRAVENOUS
  Filled 2018-09-07 (×4): qty 100

## 2018-09-07 MED ORDER — SODIUM CHLORIDE 0.9 % IV SOLN
5.0000 10*6.[IU] | Freq: Once | INTRAVENOUS | Status: AC
  Administered 2018-09-07: 5 10*6.[IU] via INTRAVENOUS
  Filled 2018-09-07: qty 5

## 2018-09-07 MED ORDER — CALCIUM CARBONATE ANTACID 500 MG PO CHEW: 2.0000 | CHEWABLE_TABLET | ORAL | Status: DC | PRN

## 2018-09-07 MED ORDER — HYDROMORPHONE HCL 1 MG/ML IJ SOLN
1.0000 mg | INTRAMUSCULAR | Status: AC | PRN
  Administered 2018-09-07: 1 mg via INTRAVENOUS
  Administered 2018-09-07 (×2): 2 mg via INTRAVENOUS
  Filled 2018-09-07 (×2): qty 2

## 2018-09-07 MED ORDER — NIFEDIPINE 10 MG PO CAPS
10.0000 mg | ORAL_CAPSULE | ORAL | Status: DC | PRN
  Administered 2018-09-07: 10 mg via ORAL
  Filled 2018-09-07: qty 1

## 2018-09-07 MED ORDER — HYDROMORPHONE HCL 1 MG/ML IJ SOLN
INTRAMUSCULAR | Status: AC
  Administered 2018-09-07: 2 mg via INTRAVENOUS
  Filled 2018-09-07: qty 1

## 2018-09-07 MED ORDER — DOCUSATE SODIUM 100 MG PO CAPS
100.0000 mg | ORAL_CAPSULE | Freq: Every day | ORAL | Status: DC
  Administered 2018-09-07: 100 mg via ORAL
  Filled 2018-09-07: qty 1

## 2018-09-07 MED ORDER — LACTATED RINGERS IV SOLN
INTRAVENOUS | Status: DC
  Administered 2018-09-08: 04:00:00 via INTRAVENOUS

## 2018-09-07 MED ORDER — LACTATED RINGERS IV BOLUS: 1000.0000 mL | Freq: Once | INTRAVENOUS | Status: DC

## 2018-09-07 MED ORDER — BUTORPHANOL TARTRATE 1 MG/ML IJ SOLN
1.0000 mg | Freq: Once | INTRAMUSCULAR | Status: AC
  Administered 2018-09-07: 1 mg via INTRAVENOUS
  Filled 2018-09-07: qty 1

## 2018-09-07 MED ORDER — SOD CITRATE-CITRIC ACID 500-334 MG/5ML PO SOLN: 30.0000 mL | ORAL | Status: DC | PRN

## 2018-09-07 MED ORDER — OXYCODONE HCL 5 MG PO TABS
5.0000 mg | ORAL_TABLET | ORAL | Status: DC | PRN
  Administered 2018-09-07 – 2018-09-08 (×4): 5 mg via ORAL
  Filled 2018-09-07 (×4): qty 1

## 2018-09-07 MED ORDER — LACTATED RINGERS IV SOLN
INTRAVENOUS | Status: DC
  Administered 2018-09-07: 07:00:00 via INTRAVENOUS

## 2018-09-07 MED ORDER — MAGNESIUM SULFATE BOLUS VIA INFUSION
4.0000 g | Freq: Once | INTRAVENOUS | Status: AC
  Administered 2018-09-07: 4 g via INTRAVENOUS
  Filled 2018-09-07: qty 500

## 2018-09-07 MED ORDER — OXYTOCIN BOLUS FROM INFUSION: 500.0000 mL | Freq: Once | INTRAVENOUS | Status: DC

## 2018-09-07 MED ORDER — PRENATAL MULTIVITAMIN CH
1.0000 | ORAL_TABLET | Freq: Every day | ORAL | Status: DC
  Administered 2018-09-07: 1 via ORAL
  Filled 2018-09-07: qty 1

## 2018-09-07 MED ORDER — LIDOCAINE HCL (PF) 1 % IJ SOLN
30.0000 mL | INTRAMUSCULAR | Status: DC | PRN
  Filled 2018-09-07: qty 30

## 2018-09-07 MED ORDER — LACTATED RINGERS IV SOLN: 500.0000 mL | INTRAVENOUS | Status: DC | PRN

## 2018-09-07 MED ORDER — METHADONE HCL 10 MG PO TABS
50.0000 mg | ORAL_TABLET | Freq: Every day | ORAL | Status: DC
  Administered 2018-09-08: 50 mg via ORAL
  Filled 2018-09-07: qty 5

## 2018-09-07 MED ORDER — MAGNESIUM SULFATE 40 G IN LACTATED RINGERS - SIMPLE
2.0000 g/h | INTRAVENOUS | Status: DC
  Filled 2018-09-07: qty 500

## 2018-09-07 NOTE — Progress Notes (Signed)
LABOR PROGRESS NOTE  Chelsey Torres is a 32 y.o. G3P0020 at [redacted]w[redacted]d admitted for preterm contractions with concern for preterm labor.   Subjective: Doing well, hungry and wanting to establish expectations for moving forward for her care. Contractions slowed down, but reports 6-8/10 pain when they occur. Denies leakage of fluid or vaginal bleeding.   Objective: BP (!) 99/55   Pulse (!) 59   Temp 98.4 F (36.9 C) (Oral)   Resp 16   Ht 5\' 5"  (1.651 m)   Wt 54.5 kg   LMP 02/20/2018   BMI 19.99 kg/m  or  Vitals:   09/07/18 0736 09/07/18 0746 09/07/18 0756 09/07/18 0900  BP: 105/63 112/61 (!) 108/58 (!) 99/55  Pulse: 69 71 71 (!) 59  Resp: 18 16 16 16   Temp: 98.4 F (36.9 C)     TempSrc: Oral     Weight:      Height:        Dilation: 4.5 Effacement (%): 80 Station: -1 Exam by:: ERIN, NP FHT: baseline rate 130, moderate varibility with small phase of minimal variablity, +acel, -decel Toco: irregular, approx every 1-5 minutes   Labs: Lab Results  Component Value Date   WBC 12.6 (H) 09/07/2018   HGB 12.6 09/07/2018   HCT 37.1 09/07/2018   MCV 89.2 09/07/2018   PLT 323 09/07/2018    Patient Active Problem List   Diagnosis Date Noted  . Preterm labor 09/07/2018  . Hepatitis C antibody test positive 08/30/2018  . GDM (gestational diabetes mellitus) 08/30/2018  . Fetal bradycardia, antepartum condition or complication 08/30/2018  . Maternal care for fetal bradycardia during pregnancy 08/30/2018  . Supervision of high risk pregnancy, antepartum, second trimester 06/15/2018  . 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 H/O herpes genitalis 06/15/2018  . Low grade squamous intraepithelial lesion on cytologic smear of cervix (LGSIL) 06/15/2018  . Polysubstance abuse (HCC) 06/15/2018  . Chlamydia infection affecting pregnancy in second trimester 06/15/2018    Assessment / Plan: 32 y.o. G3P0020 at [redacted]w[redacted]d here for premature contractions, with  concern for premature labor. Pregnancy complicated by Hepatitis C, GDM, polysubstance abuse on methadone, history of HSV, chlamydia appropriately treated. Currently receiving Magnesium and PCN, received betamethasone 9/24-9/25. GBS pending.   Labor: Continue to monitor if any progression, will do cervical check around 12 to assess. Cont Mg and PCN, GBS pending.  Fetal Wellbeing:  Cat 1 strip Pain Control:  Well managed, on home methadone  Anticipated MOD:  Pending, r/o preterm labor.  GDM: CBG's stable, last 100. Cont monitoring.   Leticia Penna, D.O. Family Medicine PGY-1  09/07/2018, 10:31 AM

## 2018-09-07 NOTE — H&P (Signed)
Chelsey Torres is a 32 y.o. female presenting for preterm contractions  Copied from MAU note: .Chelsey Torres is a 32 y.o. G3P0020 at [redacted]w[redacted]d who presents to maternity admissions reporting contractions. Ctx since last night after her work shift that were initially not painful. Since around 0330 have become painful. Can't tell how frequently they're occurring but feels like she's having a lot.  Denies vaginal bleeding, LOF, or recent intercourse. Positive fetal movement.  Went to methadone clinic at 530 this morning for her dose; came straight here afterwards as the pain increased while she was in line.  Hx of HSV. Hasn't had an outbreak in years. Had not started suppression yet.   Location: abdomen Quality: contractions Severity: 8/10 in pain scale Duration: 3 hours Timing: intermittent Modifying factors: none Associated signs and symptoms: none  OB History    Gravida  3   Para  0   Term  0   Preterm  0   AB  2   Living  0     SAB  0   TAB  2   Ectopic  0   Multiple  0   Live Births  0          Past Medical History:  Diagnosis Date  . Anxiety   . Depression   . Genital herpes   . Substance abuse [facility])    Past Surgical History:  Procedure Laterality Date  . NO PAST SURGERIES     Family History: family history includes Asthma in her brother. Social History:  reports that she has been smoking cigarettes. She has a 5.00 pack-year smoking history. She has never used smokeless tobacco. She reports that she drinks alcohol. She reports that she has current or past drug history. Drug: Marijuana. Frequency: 0.20 times per week.     Maternal Diabetes: Yes:  Diabetes Type:  Diet controlled Genetic Screening: Declined Maternal Ultrasounds/Referrals: Normal Fetal Ultrasounds or other Referrals:  None Maternal Substance Abuse:  Yes:  Type: Other: Polysubstance, now on Methadone Significant Maternal Medications:  None Significant Maternal Lab Results:  Lab values  include: Other: GBS in process, ?hx MRSA Other Comments:  Hepatitis C, Methadone  Review of Systems  Constitutional: Negative for chills and fever.  Eyes: Negative for blurred vision.  Respiratory: Negative for shortness of breath.   Cardiovascular: Negative for leg swelling.  Gastrointestinal: Positive for abdominal pain. Negative for constipation, diarrhea, nausea and vomiting.   Maternal Medical History:  Reason for admission: Contractions.  Nausea.  Contractions: Onset was 6-12 hours ago.   Frequency: irregular.   Perceived severity is moderate.    Fetal activity: Perceived fetal activity is normal.   Last perceived fetal movement was within the past hour.    Prenatal complications: Preterm labor and substance abuse.   No bleeding, PIH, infection or pre-eclampsia.   Prenatal Complications - Diabetes: gestational.    Dilation: 4.5 Effacement (%): 80 Station: -1 Exam by:: ERIN, NP Blood pressure 105/63, pulse 69, temperature 98.3 F (36.8 C), temperature source Oral, resp. rate 18, height 5\' 5"  (1.651 m), weight 54.5 kg, last menstrual period 02/20/2018. Maternal Exam:  Uterine Assessment: Contraction strength is moderate.  Contraction frequency is irregular.   Abdomen: Patient reports no abdominal tenderness. Fetal presentation: vertex  Introitus: Normal vulva. Normal vagina.  Ferning test: not done.  Nitrazine test: not done. Amniotic fluid character: not assessed.  Pelvis: adequate for delivery.   Cervix: Cervix evaluated by digital exam.     Fetal Exam  Fetal Monitor Review: Mode: ultrasound.   Baseline rate: 140.  Variability: moderate (6-25 bpm).   Pattern: accelerations present and no decelerations.    Fetal State Assessment: Category I - tracings are normal.     Physical Exam  Constitutional: She is oriented to person, place, and time. She appears well-developed and well-nourished. No distress.  HENT:  Head: Normocephalic.  Cardiovascular: Normal  rate and regular rhythm.  Respiratory: Effort normal and breath sounds normal.  GI: Soft. Bowel sounds are normal.  Genitourinary:  Genitourinary Comments: Dilation: 4.5 Effacement (%): 80 Station: -1 Exam by:: ERIN, NP   Musculoskeletal: Normal range of motion.  Neurological: She is alert and oriented to person, place, and time.  Skin: Skin is warm and dry.  Psychiatric: She has a normal mood and affect.    Prenatal labs: ABO, Rh: --/--/A POS, A POS Performed at [facility], [address] [place], [place] [address]  [phone]/24 1054) Antibody: NEG (09/24 1054) Rubella:   RPR: Non Reactive (09/04 0937)  HBsAg:    HIV: Non Reactive (09/04 0937)  GBS:     Assessment/Plan: Single intrauterine pregnancy at [redacted]w[redacted]d Preterm labor Methadone user GBS pending  Admit to [facility] per consult Dr Adrian Blackwater Routine orders Magnesium Sulfate infusion Penicillin Had Betamethasone 9/24-5 MD to follow    Wynelle Bourgeois 09/07/2018, 7:39 AM

## 2018-09-07 NOTE — Anesthesia Pain Management Evaluation Note (Signed)
  CRNA Pain Management Visit Note  Patient: Chelsey Torres, 32 y.o., female  "Hello I am a member of the anesthesia team at [facility]. We have an anesthesia team available at all times to provide care throughout the hospital, including epidural management and anesthesia for C-section. I don't know your plan for the delivery whether it a natural birth, water birth, IV sedation, nitrous supplementation, doula or epidural, but we want to meet your pain goals."   1.Was your pain managed to your expectations on prior hospitalizations?   No prior hospitalizations  2.What is your expectation for pain management during this hospitalization?     Epidural  3.How can we help you reach that goal?   Record the patient's initial score and the patient's pain goal.   Pain: 6  Pain Goal: 7 [facility] wants you to be able to say your pain was always managed very well.  Laban Emperor 09/07/2018

## 2018-09-07 NOTE — MAU Provider Note (Signed)
Chief Complaint:  Contractions   First Provider Initiated Contact with Patient 09/07/18 0617     HPI: Chelsey Torres is a 32 y.o. G3P0020 at 57w3dwho presents to maternity admissions reporting contractions. Ctx since last night after her work shift that were initially not painful. Since around 0330 have become painful. Can't tell how frequently they're occurring but feels like she's having a lot.  Denies vaginal bleeding, LOF, or recent intercourse. Positive fetal movement.  Went to methadone clinic at 530 this morning for her dose; came straight here afterwards as the pain increased while she was in line.  Hx of HSV. Hasn't had an outbreak in years. Had not started suppression yet.   Location: abdomen Quality: contractions Severity: 8/10 in pain scale Duration: 3 hours Timing: intermittent Modifying factors: none Associated signs and symptoms: none  Pregnancy Course: + Hep C, GDM no meds, methadone, received BMZ 9/24 & 9/25  Past Medical History:  Diagnosis Date  . Anxiety   . Depression   . Genital herpes   . Substance abuse ([place]    OB History  Gravida Para Term Preterm AB Living  3 0 0 0 2 0  SAB TAB Ectopic Multiple Live Births  0 2 0 0 0    # Outcome Date GA Lbr Len/2nd Weight Sex Delivery Anes PTL Lv  3 Current           2 TAB           1 TAB            Past Surgical History:  Procedure Laterality Date  . NO PAST SURGERIES     Family History  Problem Relation Age of Onset  . Asthma Brother    Social History   Tobacco Use  . Smoking status: Current Some Day Smoker    Packs/day: 0.50    Years: 10.00    Pack years: 5.00    Types: Cigarettes  . Smokeless tobacco: Never Used  Substance Use Topics  . Alcohol use: Yes    Comment: socially  . Drug use: Yes    Frequency: 0.2 times per week    Types: Marijuana    Comment: methadone, previous pain pills and heroin   Allergies  Allergen Reactions  . Tylenol [Acetaminophen] Hives    Pt. denies allergy    Medications Prior to Admission  Medication Sig Dispense Refill Last Dose  . methadone (DOLOPHINE) 10 MG tablet Take 40 mg by mouth daily.   09/07/2018 at Unknown time  . Prenatal Multivit-Min-Fe-FA (PRENATAL VITAMINS) 0.8 MG tablet Take 1 tablet by mouth daily. 30 tablet 12 09/06/2018 at Unknown time  . ACCU-CHEK FASTCLIX LANCETS MISC 1 Units by Percutaneous route 4 (four) times daily. 100 each 12 Taking  . Blood Glucose Monitoring Suppl (ACCU-CHEK NANO SMARTVIEW) w/Device KIT 1 kit by Subdermal route as directed. Check blood sugars for fasting, and two hours after breakfast, lunch and dinner (4 checks daily) 1 kit 0 Taking  . flunisolide (NASALIDE) 25 MCG/ACT (0.025%) SOLN Place 2 sprays into the nose 2 (two) times daily.   Taking  . glucose blood (ACCU-CHEK SMARTVIEW) test strip Use as instructed to check blood sugars 100 each 12 Taking  . simethicone (MYLICON) 1952MG chewable tablet Chew 250 mg by mouth every 6 (six) hours as needed for flatulence.   Taking  . vitamin C (ASCORBIC ACID) 500 MG tablet Take 1,000 mg by mouth daily.   Taking    I have reviewed patient's  Past Medical Hx, Surgical Hx, Family Hx, Social Hx, medications and allergies.   ROS:  Review of Systems  Constitutional: Negative.   Gastrointestinal: Positive for abdominal pain.  Genitourinary: Negative.     Physical Exam   Patient Vitals for the past 24 hrs:  BP Temp Temp src Pulse Resp Height Weight  09/07/18 0557 125/76 98.3 F (36.8 C) Oral 68 18 _0  (1.651 m) 54.8 kg    Constitutional: Well-developed, well-nourished female in no acute distress.  Cardiovascular: normal rate & rhythm, no murmur Respiratory: normal effort, lung sounds clear throughout GI: Abd soft, non-tender, gravid appropriate for gestational age. Pos BS x 4 MS: Extremities nontender, no edema, normal ROM Neurologic: Alert and oriented x 4.  GU:      Pelvic: NEFG, physiologic discharge, no blood, cervix clean. No lesions  Dilation:  4.5 Effacement (%): 80 Station: -1 Exam by:: Jabree Rebert, NP  NST:  Baseline: 145 bpm, Variability: Good {> 6 bpm), Accelerations: no accels and Decelerations: Variable: mild  Pt informed that the ultrasound is considered a limited OB ultrasound and is not intended to be a complete ultrasound exam.  Patient also informed that the ultrasound is not being completed with the intent of assessing for fetal or placental anomalies or any pelvic abnormalities.  Explained that the purpose of today's ultrasound is to assess for  presentation.  Patient acknowledges the purpose of the exam and the limitations of the study.  Vertex     Labs: Results for orders placed or performed during the hospital encounter of 09/07/18 (from the past 24 hour(s))  Urinalysis, Routine w reflex microscopic     Status: Abnormal   Collection Time: 09/07/18  6:05 AM  Result Value Ref Range   Color, Urine STRAW (A) YELLOW   APPearance CLEAR CLEAR   Specific Gravity, Urine 1.004 (L) 1.005 - 1.030   pH 7.0 5.0 - 8.0   Glucose, UA NEGATIVE NEGATIVE mg/dL   Hgb urine dipstick SMALL (A) NEGATIVE   Bilirubin Urine NEGATIVE NEGATIVE   Ketones, ur NEGATIVE NEGATIVE mg/dL   Protein, ur NEGATIVE NEGATIVE mg/dL   Nitrite NEGATIVE NEGATIVE   Leukocytes, UA TRACE (A) NEGATIVE   RBC / HPF 0-5 0 - 5 RBC/hpf   WBC, UA 0-5 0 - 5 WBC/hpf   Bacteria, UA RARE (A) NONE SEEN   Squamous Epithelial / LPF 0-5 0 - 5   Mucus PRESENT     Imaging:  No results found.  MAU Course: Orders Placed This Encounter  Procedures  . Culture, beta strep (group b only)  . Urinalysis, Routine w reflex microscopic   Meds ordered this encounter  Medications  . NIFEdipine (PROCARDIA) capsule 10 mg  . lactated ringers bolus 1,000 mL  . penicillin G 3 million units in sodium chloride 0.9% 100 mL IVPB    Order Specific Question:   Antibiotic Indication:    Answer:   Group B Strep Prophylaxis    MDM: Cervix last week 1 cm, not 4.5 cm with BBOW -- unable  to determine presentation Informal bedside ultrasound performed for presentation -- vertex Spec exam performed d/t HSV hx, no lesions Dr. Nehemiah Settle notified of exam --- will admit patient  Assessment: 1. Preterm uterine contractions in third trimester, antepartum   2. Hepatitis C antibody test positive   3. Gestational diabetes mellitus (GDM) in third trimester, gestational diabetes method of control unspecified   4. Methadone maintenance treatment affecting pregnancy in third trimester ([place])   5. H/O herpes genitalis  Plan: Admit to birthing suites per consult with Dr. Nehemiah Settle GBS culture pending - will give Penicillin until results Procardia protocol for ctx Continue methadone   Jorje Guild, NP 09/07/2018 6:53 AM

## 2018-09-07 NOTE — MAU Note (Signed)
PT SAYS  UC  HURT BAD SINCE 0330.   LAST Tuesday - HAD U/S-  WAS PUT ON MONITOR-  WAS HAVING UC'S.  VE- 1 CM.  HAS   HX OF HSV - NO OUTBREAK DURING PREG. .  DENIES MRSA.    LAST SEX-  GOT PREG

## 2018-09-07 NOTE — Progress Notes (Signed)
1440- Dr. Shawnie Pons informed that patient had been given stadol and shortly thereafter becoming symptomatic and stating that she feels like she was going into withdrawals. Pharmacy consulted to assist with the best method of treatment for this patient. Dr. Shawnie Pons called Dr. Malen Gauze anesthesia for recommendation as well. Orders written and Dilaudid IV and Oxycodone prn initiated until methadone dose tomorrow. After Dilaudid given, pt feeling much better.

## 2018-09-07 NOTE — Progress Notes (Signed)
1434- Pt very anxious, squirming in bed, skin is clammy and patient states that she feels like she is going through withdrawals. VSS.

## 2018-09-07 NOTE — Progress Notes (Signed)
Called by pharmacy and discussed a plan for opioid therapy in the setting recently receiving an opioid antagonist (Stadol). Half life of stadol is approximately 4 hrs.   Plan for pain management:  -Continue Methadone 50mg  daily, next dose 10/3 -Dilaudid 1mg  every 3 hrs PRN to end at 10 PM -Oxycodone IR 5mg  q3 hr prn to end at 0500AM  Federico Flake, MD, MPH, ABFM Attending Physician Faculty Practice- [facility]

## 2018-09-07 NOTE — Progress Notes (Signed)
[place] notified of pt c/o after receiving Stadol IV for pain. Pt states she is feeling clammy, hot and cold, and restless and cant be still. O2 sats 100 RA. Unable to get BP at this time

## 2018-09-08 ENCOUNTER — Observation Stay (HOSPITAL_COMMUNITY): Payer: Medicaid Other | Admitting: Anesthesiology

## 2018-09-08 ENCOUNTER — Other Ambulatory Visit: Payer: Self-pay

## 2018-09-08 DIAGNOSIS — O99334 Smoking (tobacco) complicating childbirth: Secondary | ICD-10-CM | POA: Diagnosis present

## 2018-09-08 DIAGNOSIS — O4703 False labor before 37 completed weeks of gestation, third trimester: Secondary | ICD-10-CM | POA: Diagnosis not present

## 2018-09-08 DIAGNOSIS — O24420 Gestational diabetes mellitus in childbirth, diet controlled: Secondary | ICD-10-CM | POA: Diagnosis present

## 2018-09-08 DIAGNOSIS — O99324 Drug use complicating childbirth: Secondary | ICD-10-CM | POA: Diagnosis present

## 2018-09-08 DIAGNOSIS — Z8614 Personal history of Methicillin resistant Staphylococcus aureus infection: Secondary | ICD-10-CM | POA: Diagnosis not present

## 2018-09-08 DIAGNOSIS — F1120 Opioid dependence, uncomplicated: Secondary | ICD-10-CM | POA: Diagnosis not present

## 2018-09-08 DIAGNOSIS — F17210 Nicotine dependence, cigarettes, uncomplicated: Secondary | ICD-10-CM | POA: Diagnosis present

## 2018-09-08 DIAGNOSIS — Z3A33 33 weeks gestation of pregnancy: Secondary | ICD-10-CM

## 2018-09-08 DIAGNOSIS — O9842 Viral hepatitis complicating childbirth: Secondary | ICD-10-CM | POA: Diagnosis present

## 2018-09-08 DIAGNOSIS — B1920 Unspecified viral hepatitis C without hepatic coma: Secondary | ICD-10-CM | POA: Diagnosis present

## 2018-09-08 DIAGNOSIS — Z23 Encounter for immunization: Secondary | ICD-10-CM | POA: Diagnosis not present

## 2018-09-08 DIAGNOSIS — F1110 Opioid abuse, uncomplicated: Secondary | ICD-10-CM | POA: Diagnosis present

## 2018-09-08 MED ORDER — OXYTOCIN 40 UNITS IN LACTATED RINGERS INFUSION - SIMPLE MED
1.0000 m[IU]/min | INTRAVENOUS | Status: DC
  Administered 2018-09-08: 2 m[IU]/min via INTRAVENOUS

## 2018-09-08 MED ORDER — OXYTOCIN BOLUS FROM INFUSION
500.0000 mL | Freq: Once | INTRAVENOUS | Status: AC
  Administered 2018-09-08: 500 mL via INTRAVENOUS

## 2018-09-08 MED ORDER — OXYCODONE-ACETAMINOPHEN 5-325 MG PO TABS: 2.0000 | ORAL_TABLET | ORAL | Status: DC | PRN

## 2018-09-08 MED ORDER — TERBUTALINE SULFATE 1 MG/ML IJ SOLN: 0.2500 mg | Freq: Once | INTRAMUSCULAR | Status: DC | PRN

## 2018-09-08 MED ORDER — PHENYLEPHRINE 40 MCG/ML (10ML) SYRINGE FOR IV PUSH (FOR BLOOD PRESSURE SUPPORT)
80.0000 ug | PREFILLED_SYRINGE | INTRAVENOUS | Status: DC | PRN
  Filled 2018-09-08 (×2): qty 10
  Filled 2018-09-08: qty 5

## 2018-09-08 MED ORDER — NIFEDIPINE ER 60 MG PO TB24
60.0000 mg | ORAL_TABLET | Freq: Two times a day (BID) | ORAL | Status: DC
  Administered 2018-09-08: 60 mg via ORAL
  Filled 2018-09-08: qty 1
  Filled 2018-09-08: qty 2
  Filled 2018-09-08: qty 1

## 2018-09-08 MED ORDER — FENTANYL CITRATE (PF) 100 MCG/2ML IJ SOLN
50.0000 ug | Freq: Once | INTRAMUSCULAR | Status: AC
  Administered 2018-09-08: 50 ug via INTRAVENOUS
  Filled 2018-09-08: qty 2

## 2018-09-08 MED ORDER — PHENYLEPHRINE 40 MCG/ML (10ML) SYRINGE FOR IV PUSH (FOR BLOOD PRESSURE SUPPORT)
80.0000 ug | PREFILLED_SYRINGE | INTRAVENOUS | Status: DC | PRN
  Filled 2018-09-08: qty 5

## 2018-09-08 MED ORDER — LACTATED RINGERS IV SOLN
INTRAVENOUS | Status: DC
  Administered 2018-09-08: 20:00:00 via INTRAVENOUS

## 2018-09-08 MED ORDER — FLEET ENEMA 7-19 GM/118ML RE ENEM: 1.0000 | ENEMA | RECTAL | Status: DC | PRN

## 2018-09-08 MED ORDER — LACTATED RINGERS IV SOLN: 500.0000 mL | INTRAVENOUS | Status: DC | PRN

## 2018-09-08 MED ORDER — FENTANYL 2.5 MCG/ML BUPIVACAINE 1/10 % EPIDURAL INFUSION (WH - ANES)
14.0000 mL/h | INTRAMUSCULAR | Status: DC | PRN
  Administered 2018-09-08 (×2): 14 mL/h via EPIDURAL
  Filled 2018-09-08 (×2): qty 100

## 2018-09-08 MED ORDER — LACTATED RINGERS IV SOLN: 500.0000 mL | Freq: Once | INTRAVENOUS | Status: DC

## 2018-09-08 MED ORDER — BUPIVACAINE HCL (PF) 0.25 % IJ SOLN
INTRAMUSCULAR | Status: DC | PRN
  Administered 2018-09-08: 8 mL via EPIDURAL

## 2018-09-08 MED ORDER — SOD CITRATE-CITRIC ACID 500-334 MG/5ML PO SOLN: 30.0000 mL | ORAL | Status: DC | PRN

## 2018-09-08 MED ORDER — LIDOCAINE HCL (PF) 1 % IJ SOLN
30.0000 mL | INTRAMUSCULAR | Status: DC | PRN
  Filled 2018-09-08: qty 30

## 2018-09-08 MED ORDER — OXYCODONE-ACETAMINOPHEN 5-325 MG PO TABS: 1.0000 | ORAL_TABLET | ORAL | Status: DC | PRN

## 2018-09-08 MED ORDER — EPHEDRINE 5 MG/ML INJ
10.0000 mg | INTRAVENOUS | Status: DC | PRN
  Filled 2018-09-08: qty 2

## 2018-09-08 MED ORDER — DIPHENHYDRAMINE HCL 50 MG/ML IJ SOLN: 12.5000 mg | INTRAMUSCULAR | Status: DC | PRN

## 2018-09-08 MED ORDER — OXYTOCIN 40 UNITS IN LACTATED RINGERS INFUSION - SIMPLE MED
2.5000 [IU]/h | INTRAVENOUS | Status: DC
  Administered 2018-09-08: 2.5 [IU]/h via INTRAVENOUS
  Filled 2018-09-08: qty 1000

## 2018-09-08 MED ORDER — LIDOCAINE HCL (PF) 1 % IJ SOLN
INTRAMUSCULAR | Status: DC | PRN
  Administered 2018-09-08: 5 mL via EPIDURAL

## 2018-09-08 MED ORDER — FENTANYL CITRATE (PF) 100 MCG/2ML IJ SOLN
100.0000 ug | INTRAMUSCULAR | Status: AC
  Administered 2018-09-08: 100 ug via INTRAVENOUS
  Filled 2018-09-08: qty 2

## 2018-09-08 MED ORDER — FENTANYL CITRATE (PF) 100 MCG/2ML IJ SOLN: 100.0000 ug | Freq: Once | INTRAMUSCULAR | Status: DC

## 2018-09-08 MED ORDER — PENICILLIN G 3 MILLION UNITS IVPB - SIMPLE MED
3.0000 10*6.[IU] | INTRAVENOUS | Status: DC
  Administered 2018-09-08: 3 10*6.[IU] via INTRAVENOUS
  Filled 2018-09-08 (×4): qty 100

## 2018-09-08 MED ORDER — SODIUM CHLORIDE 0.9 % IV SOLN
5.0000 10*6.[IU] | Freq: Once | INTRAVENOUS | Status: AC
  Administered 2018-09-08: 5 10*6.[IU] via INTRAVENOUS
  Filled 2018-09-08: qty 5

## 2018-09-08 MED ORDER — ACETAMINOPHEN 325 MG PO TABS: 650.0000 mg | ORAL_TABLET | ORAL | Status: DC | PRN

## 2018-09-08 MED ORDER — ONDANSETRON HCL 4 MG/2ML IJ SOLN: 4.0000 mg | Freq: Four times a day (QID) | INTRAMUSCULAR | Status: DC | PRN

## 2018-09-08 NOTE — Progress Notes (Signed)
Vitals:   09/08/18 1900 09/08/18 1930  BP: (!) 107/54 101/73  Pulse: 79 98  Resp:  18  Temp:  98 F (36.7 C)  SpO2:     Comfortable w/epidural.  cx 9/100/+1  AROM clear fluid. FHR Cat 1.  Anticipate 2nd stage soon

## 2018-09-08 NOTE — Progress Notes (Signed)
Patient ID: Chelsey Torres, female   DOB: 1986/05/11, 32 y.o.   MRN: [id number] FACULTY PRACTICE ANTEPARTUM(COMPREHENSIVE) NOTE  Chelsey Torres is a 32 y.o. G3P0020 at [redacted]w[redacted]d by best clinical estimate who is admitted for Preterm labor.   Fetal presentation is cephalic. Length of Stay:  1  Days  Subjective: Reports that she is having painful contractions about 1/hour. Her Magnesium was stopped and then her contractions became q 10 mins. Given Procardia 60 mg after this. Patient reports the fetal movement as active. Patient reports uterine contraction  activity as regular, every 10 minutes. Patient reports  vaginal bleeding as none. Patient describes fluid per vagina as None.  Vitals:  Blood pressure 109/66, pulse 70, temperature 98.1 F (36.7 C), temperature source Oral, resp. rate 16, height 5\' 5"  (1.651 m), weight 54.5 kg, last menstrual period 02/20/2018, SpO2 100 %. Physical Examination:  General appearance - alert, well appearing, and in no distress Chest - normal effort Abdomen - gravid, non-tender Fundal Height:  size equals dates Extremities: Homans sign is negative, no sign of DVT  Membranes:intact Dilation: 5.5 Effacement (%): 90 Station: -2 Presentation: Vertex Exam by:: Shawnie Pons  Fetal Monitoring:  Baseline: 135 bpm, Variability: Good {> 6 bpm), Accelerations: Reactive and Decelerations: Absent   Medications:  Scheduled . docusate sodium  100 mg Oral Daily  . enoxaparin (LOVENOX) injection  40 mg Subcutaneous Q24H  . methadone  50 mg Oral Daily  . NIFEdipine  60 mg Oral BID  . prenatal multivitamin  1 tablet Oral Q1200   I have reviewed the patient's current medications.  ASSESSMENT: Active Problems:   Preterm labor   Threatened preterm labor   PLAN: Will re-check in 1 hour. If laboring transfer to [facility] and give her an epidural. Trial of Fentanyl for now  Reva Bores, MD 09/08/2018,2:33 PM

## 2018-09-08 NOTE — Anesthesia Preprocedure Evaluation (Signed)
Anesthesia Evaluation  Patient identified by MRN, date of birth, ID band Patient awake    Reviewed: Allergy & Precautions, NPO status , Patient's Chart, lab work & pertinent test results  Airway Mallampati: II  TM Distance: >3 FB Neck ROM: Full    Dental no notable dental hx. (+) Teeth Intact, Dental Advisory Given   Pulmonary Current Smoker,    Pulmonary exam normal breath sounds clear to auscultation       Cardiovascular negative cardio ROS Normal cardiovascular exam Rhythm:Regular Rate:Normal     Neuro/Psych PSYCHIATRIC DISORDERS    GI/Hepatic (+)     substance abuse  , Hepatitis -, COn Methadone   Endo/Other    Renal/GU      Musculoskeletal   Abdominal   Peds  Hematology   Anesthesia Other Findings PPROM  Reproductive/Obstetrics (+) Pregnancy                             Lab Results  Component Value Date   WBC 12.6 (H) 09/07/2018   HGB 12.6 09/07/2018   HCT 37.1 09/07/2018   MCV 89.2 09/07/2018   PLT 323 09/07/2018    Anesthesia Physical Anesthesia Plan  ASA: III  Anesthesia Plan: Epidural   Post-op Pain Management:    Induction:   PONV Risk Score and Plan:   Airway Management Planned:   Additional Equipment:   Intra-op Plan:   Post-operative Plan:   Informed Consent: I have reviewed the patients History and Physical, chart, labs and discussed the procedure including the risks, benefits and alternatives for the proposed anesthesia with the patient or authorized representative who has indicated his/her understanding and acceptance.     Plan Discussed with:   Anesthesia Plan Comments:         Anesthesia Quick Evaluation

## 2018-09-08 NOTE — Progress Notes (Signed)
Vitals:   09/08/18 2131 09/08/18 2133  BP: (!) 93/37 99/62  Pulse: 69 71  Resp: 18 16  Temp:    SpO2:     FHR had prolonged decel vs maternal HR.  Now Cat 1.  Pitocin was turned off (started at C/C/+1), ctx stopped all together. Pit restarted, Cx C/C/+2.  Some pressure, wants to start pushing. WIll start pushing when ctx get regular.

## 2018-09-08 NOTE — Progress Notes (Signed)
Pt became mad and agitated at the pushing process.  Wants to "just be done"  RN suggested resting on the peanut, pt agreed.  WIll allow to labor down and start pushing in a little while

## 2018-09-08 NOTE — Progress Notes (Signed)
Have allowed 1 hour to pass since last cervix check. She is still quite uncomfortable with contractions. Given Fentanyl with no relief and notes contractions are stronger. I witnessed a contraction and she is breathing through them. They feel moderate. She is now  Dilation: 6 Effacement (%): 90 Station: -2 Presentation: Vertex Exam by:: Myha Arizpe Will transfer to BS and allow epidural.

## 2018-09-08 NOTE — Progress Notes (Signed)
Nurse has been in and out of room adjusting monitor, assessing toco, palpating, making phone calls. Pain medicine given. Patient still uncomfortable and MD aware. Pt to be reassessed 1500.

## 2018-09-08 NOTE — Anesthesia Procedure Notes (Signed)
Epidural Patient location during procedure: OB Start time: 09/08/2018 4:22 PM End time: 09/08/2018 4:37 PM  Staffing Anesthesiologist: Trevor Iha, MD Performed: anesthesiologist   Preanesthetic Checklist Completed: patient identified, site marked, surgical consent, pre-op evaluation, timeout performed, IV checked, risks and benefits discussed and monitors and equipment checked  Epidural Patient position: sitting Prep: site prepped and draped and DuraPrep Patient monitoring: continuous pulse ox and blood pressure Approach: midline Location: L3-L4 Injection technique: LOR air  Needle:  Needle type: Tuohy  Needle gauge: 17 G Needle length: 9 cm and 9 Needle insertion depth: 5 cm cm Catheter type: closed end flexible Catheter size: 19 Gauge Catheter at skin depth: 10 cm Test dose: negative  Assessment Events: blood not aspirated, injection not painful, no injection resistance, negative IV test and no paresthesia  Additional Notes 1 attempt. Pt tolerated procedure well.

## 2018-09-09 ENCOUNTER — Encounter (HOSPITAL_COMMUNITY): Payer: Self-pay

## 2018-09-09 LAB — CULTURE, BETA STREP (GROUP B ONLY)

## 2018-09-09 MED ORDER — METHYLERGONOVINE MALEATE 0.2 MG PO TABS: 0.2000 mg | ORAL_TABLET | ORAL | Status: DC | PRN

## 2018-09-09 MED ORDER — FERROUS SULFATE 325 (65 FE) MG PO TABS
325.0000 mg | ORAL_TABLET | Freq: Two times a day (BID) | ORAL | Status: DC
  Administered 2018-09-09 – 2018-09-10 (×3): 325 mg via ORAL
  Filled 2018-09-09 (×3): qty 1

## 2018-09-09 MED ORDER — BISACODYL 10 MG RE SUPP: 10.0000 mg | Freq: Every day | RECTAL | Status: DC | PRN

## 2018-09-09 MED ORDER — BENZOCAINE-MENTHOL 20-0.5 % EX AERO: 1.0000 "application " | INHALATION_SPRAY | CUTANEOUS | Status: DC | PRN

## 2018-09-09 MED ORDER — ONDANSETRON HCL 4 MG/2ML IJ SOLN: 4.0000 mg | INTRAMUSCULAR | Status: DC | PRN

## 2018-09-09 MED ORDER — FLEET ENEMA 7-19 GM/118ML RE ENEM: 1.0000 | ENEMA | Freq: Every day | RECTAL | Status: DC | PRN

## 2018-09-09 MED ORDER — IBUPROFEN 600 MG PO TABS
600.0000 mg | ORAL_TABLET | Freq: Four times a day (QID) | ORAL | Status: DC
  Administered 2018-09-09 – 2018-09-10 (×5): 600 mg via ORAL
  Filled 2018-09-09 (×5): qty 1

## 2018-09-09 MED ORDER — COCONUT OIL OIL: 1.0000 "application " | TOPICAL_OIL | Status: DC | PRN

## 2018-09-09 MED ORDER — TETANUS-DIPHTH-ACELL PERTUSSIS 5-2.5-18.5 LF-MCG/0.5 IM SUSP: 0.5000 mL | Freq: Once | INTRAMUSCULAR | Status: DC

## 2018-09-09 MED ORDER — METHYLERGONOVINE MALEATE 0.2 MG/ML IJ SOLN: 0.2000 mg | INTRAMUSCULAR | Status: DC | PRN

## 2018-09-09 MED ORDER — DIPHENHYDRAMINE HCL 25 MG PO CAPS: 25.0000 mg | ORAL_CAPSULE | Freq: Four times a day (QID) | ORAL | Status: DC | PRN

## 2018-09-09 MED ORDER — DOCUSATE SODIUM 100 MG PO CAPS
100.0000 mg | ORAL_CAPSULE | Freq: Two times a day (BID) | ORAL | Status: DC
  Administered 2018-09-09 – 2018-09-10 (×4): 100 mg via ORAL
  Filled 2018-09-09 (×6): qty 1

## 2018-09-09 MED ORDER — DIBUCAINE 1 % RE OINT: 1.0000 "application " | TOPICAL_OINTMENT | RECTAL | Status: DC | PRN

## 2018-09-09 MED ORDER — METHADONE HCL 10 MG PO TABS
50.0000 mg | ORAL_TABLET | Freq: Every day | ORAL | Status: DC
  Administered 2018-09-09 – 2018-09-10 (×2): 50 mg via ORAL
  Filled 2018-09-09 (×2): qty 5

## 2018-09-09 MED ORDER — WITCH HAZEL-GLYCERIN EX PADS: 1.0000 "application " | MEDICATED_PAD | CUTANEOUS | Status: DC | PRN

## 2018-09-09 MED ORDER — ONDANSETRON HCL 4 MG PO TABS: 4.0000 mg | ORAL_TABLET | ORAL | Status: DC | PRN

## 2018-09-09 MED ORDER — PNEUMOCOCCAL VAC POLYVALENT 25 MCG/0.5ML IJ INJ
0.5000 mL | INJECTION | INTRAMUSCULAR | Status: AC
  Administered 2018-09-10: 0.5 mL via INTRAMUSCULAR
  Filled 2018-09-09 (×2): qty 0.5

## 2018-09-09 MED ORDER — SIMETHICONE 80 MG PO CHEW: 80.0000 mg | CHEWABLE_TABLET | ORAL | Status: DC | PRN

## 2018-09-09 MED ORDER — PRENATAL MULTIVITAMIN CH
1.0000 | ORAL_TABLET | Freq: Every day | ORAL | Status: DC
  Administered 2018-09-09: 1 via ORAL
  Filled 2018-09-09 (×2): qty 1

## 2018-09-09 MED ORDER — MEASLES, MUMPS & RUBELLA VAC ~~LOC~~ INJ
0.5000 mL | INJECTION | Freq: Once | SUBCUTANEOUS | Status: DC
  Filled 2018-09-09: qty 0.5

## 2018-09-09 NOTE — Progress Notes (Signed)
Post Partum Day 1 Subjective: no complaints, up ad lib, voiding and tolerating PO  Objective: Blood pressure 111/77, pulse 72, temperature 97.8 F (36.6 C), temperature source Oral, resp. rate 18, height 5\' 5"  (1.651 m), weight 54.5 kg, last menstrual period 02/20/2018, SpO2 100 %, unknown if currently breastfeeding.  Physical Exam:  General: alert, cooperative and appears stated age Lochia: appropriate Uterine Fundus: firm Incision: healing well DVT Evaluation: No evidence of DVT seen on physical exam.  Recent Labs    09/07/18 0638  HGB 12.6  HCT 37.1    Assessment/Plan: Plan for discharge tomorrow, Breastfeeding, Social Work consult and Contraception Nexplanon   LOS: 2 days   Reva Bores 09/09/2018, 1:50 PM

## 2018-09-09 NOTE — Lactation Note (Signed)
This note was copied from a baby's chart. Lactation Consultation Note  Patient Name: Chelsey Torres [id number] Date: 09/09/2018   Mom positive Hep C,  Delivery of [redacted] week gestation infant at 5 pounds and 1 oz. Pump kit given to mom and pump in room.  Mom on Methadone with a hx of substance abuse, psychiatric disorder, and smoker. NICU infant. Attempt to see mom.  Knocked and waited for mom to say okay.  Entered room.  Introduced myself and mom asked if I could come back.  I inquired at what time and she reported she would just call or ask to see [place].   Maternal Data    Feeding    LATCH Score                   Interventions    Lactation Tools Discussed/Used     Consult Status      Chelsey Torres 09/09/2018, 9:46 AM

## 2018-09-09 NOTE — Progress Notes (Signed)
EDBP set up and mom educated on it.

## 2018-09-09 NOTE — Anesthesia Postprocedure Evaluation (Signed)
Anesthesia Post Note  Patient: TAYLEY MUDRICK  Procedure(s) Performed: AN AD HOC LABOR EPIDURAL     Patient location during evaluation: [facility] Anesthesia Type: Epidural Level of consciousness: awake and alert Pain management: pain level controlled Vital Signs Assessment: post-procedure vital signs reviewed and stable Respiratory status: spontaneous breathing Cardiovascular status: blood pressure returned to baseline Postop Assessment: no headache, patient able to bend at knees, no backache, no apparent nausea or vomiting, epidural receding, adequate PO intake and able to ambulate Anesthetic complications: no    Last Vitals:  Vitals:   09/09/18 0459 09/09/18 0803  BP: 103/74 99/64  Pulse: (!) 57   Resp: 18 18  Temp: 37.2 C 37 C  SpO2: 98% 100%    Last Pain:  Vitals:   09/09/18 0803  TempSrc: Oral  PainSc:    Pain Goal: Patients Stated Pain Goal: 3 (09/09/18 0730)               Salome Arnt

## 2018-09-09 NOTE — Progress Notes (Signed)
CSW acknowledges consult and completed clinical assessment.  Clinical documentation will follow.  There are no barriers to MOB's discharge.  Nealy Karapetian Boyd-Gilyard, MSW, LCSW Clinical Social Work [phone]   

## 2018-09-10 MED ORDER — IBUPROFEN 600 MG PO TABS: 600.0000 mg | ORAL_TABLET | Freq: Four times a day (QID) | ORAL | 2 refills | Status: DC | PRN

## 2018-09-10 MED ORDER — DOCUSATE SODIUM 100 MG PO CAPS: 100.0000 mg | ORAL_CAPSULE | Freq: Two times a day (BID) | ORAL | 2 refills | Status: DC | PRN

## 2018-09-10 MED ORDER — FERROUS SULFATE 325 (65 FE) MG PO TABS: 325.0000 mg | ORAL_TABLET | Freq: Two times a day (BID) | ORAL | 3 refills | Status: DC

## 2018-09-10 NOTE — Progress Notes (Signed)
Discharge instructions given to patient and she verbalized understanding of all instructions given. Written copy of AVS given to patient. 

## 2018-09-10 NOTE — Discharge Instructions (Signed)
Vaginal Delivery, Care After °Refer to this sheet in the next few weeks. These instructions provide you with information about caring for yourself after vaginal delivery. Your health care provider may also give you more specific instructions. Your treatment has been planned according to current medical practices, but problems sometimes occur. Call your health care provider if you have any problems or questions. °What can I expect after the procedure? °After vaginal delivery, it is common to have: °· Some bleeding from your vagina. °· Soreness in your abdomen, your vagina, and the area of skin between your vaginal opening and your anus (perineum). °· Pelvic cramps. °· Fatigue. ° °Follow these instructions at home: °Medicines °· Take over-the-counter and prescription medicines only as told by your health care provider. °· If you were prescribed an antibiotic medicine, take it as told by your health care provider. Do not stop taking the antibiotic until it is finished. °Driving ° °· Do not drive or operate heavy machinery while taking prescription pain medicine. °· Do not drive for 24 hours if you received a sedative. °Lifestyle °· Do not drink alcohol. This is especially important if you are breastfeeding or taking medicine to relieve pain. °· Do not use tobacco products, including cigarettes, chewing tobacco, or e-cigarettes. If you need help quitting, ask your health care provider. °Eating and drinking °· Drink at least 8 eight-ounce glasses of water every day unless you are told not to by your health care provider. If you choose to breastfeed your baby, you may need to drink more water than this. °· Eat high-fiber foods every day. These foods may help prevent or relieve constipation. High-fiber foods include: °? Whole grain cereals and breads. °? Brown rice. °? Beans. °? Fresh fruits and vegetables. °Activity °· Return to your normal activities as told by your health care provider. Ask your health care provider  what activities are safe for you. °· Rest as much as possible. Try to rest or take a nap when your baby is sleeping. °· Do not lift anything that is heavier than your baby or 10 lb (4.5 kg) until your health care provider says that it is safe. °· Talk with your health care provider about when you can engage in sexual activity. This may depend on your: °? Risk of infection. °? Rate of healing. °? Comfort and desire to engage in sexual activity. °Vaginal Care °· If you have an episiotomy or a vaginal tear, check the area every day for signs of infection. Check for: °? More redness, swelling, or pain. °? More fluid or blood. °? Warmth. °? Pus or a bad smell. °· Do not use tampons or douches until your health care provider says this is safe. °· Watch for any blood clots that may pass from your vagina. These may look like clumps of dark red, brown, or black discharge. °General instructions °· Keep your perineum clean and dry as told by your health care provider. °· Wear loose, comfortable clothing. °· Wipe from front to back when you use the toilet. °· Ask your health care provider if you can shower or take a bath. If you had an episiotomy or a perineal tear during labor and delivery, your health care provider may tell you not to take baths for a certain length of time. °· Wear a bra that supports your breasts and fits you well. °· If possible, have someone help you with household activities and help care for your baby for at least a few days after   you leave the hospital. °· Keep all follow-up visits for you and your baby as told by your health care provider. This is important. °Contact a health care provider if: °· You have: °? Vaginal discharge that has a bad smell. °? Difficulty urinating. °? Pain when urinating. °? A sudden increase or decrease in the frequency of your bowel movements. °? More redness, swelling, or pain around your episiotomy or vaginal tear. °? More fluid or blood coming from your episiotomy or  vaginal tear. °? Pus or a bad smell coming from your episiotomy or vaginal tear. °? A fever. °? A rash. °? Little or no interest in activities you used to enjoy. °? Questions about caring for yourself or your baby. °· Your episiotomy or vaginal tear feels warm to the touch. °· Your episiotomy or vaginal tear is separating or does not appear to be healing. °· Your breasts are painful, hard, or turn red. °· You feel unusually sad or worried. °· You feel nauseous or you vomit. °· You pass large blood clots from your vagina. If you pass a blood clot from your vagina, save it to show to your health care provider. Do not flush blood clots down the toilet without having your health care provider look at them. °· You urinate more than usual. °· You are dizzy or light-headed. °· You have not breastfed at all and you have not had a menstrual period for 12 weeks after delivery. °· You have stopped breastfeeding and you have not had a menstrual period for 12 weeks after you stopped breastfeeding. °Get help right away if: °· You have: °? Pain that does not go away or does not get better with medicine. °? Chest pain. °? Difficulty breathing. °? Blurred vision or spots in your vision. °? Thoughts about hurting yourself or your baby. °· You develop pain in your abdomen or in one of your legs. °· You develop a severe headache. °· You faint. °· You bleed from your vagina so much that you fill two sanitary pads in one hour. °This information is not intended to replace advice given to you by your health care provider. Make sure you discuss any questions you have with your health care provider. °Document Released: 11/20/2000 Document Revised: 05/06/2016 Document Reviewed: 12/08/2015 °Elsevier Interactive Patient Education © 2018 [employer] ° °

## 2018-09-10 NOTE — Progress Notes (Signed)
Copy of patient's MAR given on discharge for the Methadone clinic.

## 2018-09-10 NOTE — Discharge Summary (Signed)
Postpartum Discharge Summary     Patient Name: Chelsey Torres DOB: 1986/03/14 MRN: [id number]  Date of admission: 09/07/2018 Delivering Provider: Christin Fudge   Date of discharge: 09/10/2018  Admitting diagnosis: 74WKS CTX Intrauterine pregnancy: [redacted]w[redacted]d    Secondary diagnosis:  Principal Problem:   Preterm delivery, delivered Active Problems:   Methadone maintenance treatment affecting pregnancy ([name]   GDM (gestational diabetes mellitus)  Additional problems:None     Discharge diagnosis: Preterm Pregnancy Delivered, GDM A1 and Methadone treatment                                                                                                Post partum procedures:None  Augmentation: AROM and Pitocin  Complications: None  Hospital course:  Onset of Labor With Vaginal Delivery     32y.o. yo [id number] admitted in active preterm labor on 09/07/2018. She has previously received BMZ on 9/24 and 9/25.   Patient had an uncomplicated labor course as follows:  Membrane Rupture Time/Date: 7:32 PM ,09/08/2018   Intrapartum Procedures: Episiotomy: None [1]                                         Lacerations:  1st degree [2];Perineal [11]  Patient had a preterm delivery of a Viable infant on 09/08/2018  Information for the patient's newborn:  LuAnalys, Ryden0[[id number]]Delivery Method: Vaginal, Spontaneous(Filed from Delivery Summary)  Patient had an uncomplicated postpartum course. Methadone treatment was continued.  She is ambulating, tolerating a regular diet, passing flatus, and urinating well. Patient is discharged home in stable condition on 09/10/18.  Physical exam  Vitals:   09/09/18 1617 09/09/18 1937 09/09/18 2327 09/10/18 0401  BP: 93/67 100/61 107/66 98/67  Pulse: 66 63 (!) 59 67  Resp: '18 16 16 16  ' Temp: 98.4 F (36.9 C) 97.9 F (36.6 C) 98.8 F (37.1 C) 98.5 F (36.9 C)  TempSrc: Oral Oral Oral Oral  SpO2: 100% 100% 100% 100%  Weight:       Height:       General: alert and no distress Lochia: appropriate Uterine Fundus: firm Incision: N/A DVT Evaluation: No evidence of DVT seen on physical exam. Negative Homan's sign. No cords or calf tenderness. No significant calf/ankle edema. Labs: Lab Results  Component Value Date   WBC 12.6 (H) 09/07/2018   HGB 12.6 09/07/2018   HCT 37.1 09/07/2018   MCV 89.2 09/07/2018   PLT 323 09/07/2018   CMP Latest Ref Rng & Units 09/07/2018  Glucose 70 - 99 mg/dL -  BUN 6 - 23 mg/dL -  Creatinine 0.44 - 1.00 mg/dL 0.61  Sodium 135 - 145 mEq/L -  Potassium 3.5 - 5.1 mEq/L -  Chloride 96 - 112 mEq/L -  CO2 19 - 32 mEq/L -  Calcium 8.4 - 10.5 mg/dL -    Discharge instruction: per After Visit Summary and "Baby and Me Booklet".  After visit meds:  Allergies as of 09/10/2018  Reactions   Tylenol [acetaminophen] Hives   Large doses      Medication List    STOP taking these medications   ACCU-CHEK FASTCLIX LANCETS Misc   ACCU-CHEK NANO SMARTVIEW w/Device Kit   glucose blood test strip     TAKE these medications   docusate sodium 100 MG capsule Commonly known as:  COLACE Take 1 capsule (100 mg total) by mouth 2 (two) times daily as needed for mild constipation or moderate constipation.   ferrous sulfate 325 (65 FE) MG tablet Take 1 tablet (325 mg total) by mouth 2 (two) times daily with a meal.   ibuprofen 600 MG tablet Commonly known as:  ADVIL,MOTRIN Take 1 tablet (600 mg total) by mouth every 6 (six) hours as needed for headache, moderate pain or cramping.   methadone 10 MG/ML solution Commonly known as:  DOLOPHINE Take 50 mg by mouth daily. [place], [place]   Prenatal Vitamins 0.8 MG tablet Take 1 tablet by mouth daily.       Diet: routine diet  Activity: Advance as tolerated. Pelvic rest for 6 weeks.   Postpartum Contraception: Nexplanon  Outpatient follow up: 2 weeks [facility] counselor) and 6 weeks(Postpartum visit and 2 hr GTT)  Newborn  Data: Live born female  Birth Weight: 5 lb 0.1 oz (2270 g) APGAR: 8, 8  Newborn Delivery   Birth date/time:  09/08/2018 23:38:00 Delivery type:  Vaginal, Spontaneous     Baby Feeding: Breast Disposition:NICU   09/10/2018 Verita Schneiders, MD
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
## 2018-09-13 ENCOUNTER — Encounter: Payer: Medicaid Other | Admitting: Advanced Practice Midwife

## 2018-09-13 ENCOUNTER — Ambulatory Visit (INDEPENDENT_AMBULATORY_CARE_PROVIDER_SITE_OTHER): Payer: Medicaid Other | Admitting: Student

## 2018-09-13 ENCOUNTER — Ambulatory Visit: Payer: Self-pay

## 2018-09-13 VITALS — BP 123/70 | HR 72 | Wt 114.3 lb

## 2018-09-13 DIAGNOSIS — N61 Mastitis without abscess: Secondary | ICD-10-CM | POA: Diagnosis not present

## 2018-09-13 HISTORY — DX: Mastitis without abscess: N61.0

## 2018-09-13 MED ORDER — CEPHALEXIN 500 MG PO CAPS: 500.0000 mg | ORAL_CAPSULE | Freq: Four times a day (QID) | ORAL | 0 refills | Status: DC

## 2018-09-13 NOTE — Lactation Note (Addendum)
This note was copied from a baby's chart. Lactation Consultation Note  Patient Name: Chelsey Torres Date: 09/13/2018 Reason for consult: Follow-up assessment;NICU baby   Follow up with mom at request of Marylynn Pearson in [facility] [employer]. Mom reports she has fullness and redness to the left breast. She reports she iced and pumped both breasts recently and obtained 30 cc from the left and 45 cc from the right breast. She reports she does not have flu like symptoms nor fever. Informed mom that clinic is open from 5-8 so mom can go see a provider to evaluate for Mastitis. Enc mom to continue ice packs followed by reverse pressure and pumping every 2-3 hours to relieve engorgement symptoms. Mom voiced understanding.    Maternal Data    Feeding Feeding Type: Donor Breast Milk  LATCH Score                   Interventions    Lactation Tools Discussed/Used     Consult Status Consult Status: PRN Follow-up type: In-patient    Silas Flood Sumie Remsen 09/13/2018, 5:21 PM

## 2018-09-13 NOTE — Progress Notes (Signed)
  Subjective:     Patient ID: Chelsey Torres, female   DOB: [birth year], 32 y.o.   MRN: [id number]  HPI Patient here complaints of painful red breasts that are hard. She noticed red spots this morning at 5 am; was seen by LC this afternoon. Has plan for pumping/ice and heat.   Review of Systems  Constitutional: Negative.   HENT: Negative.   Respiratory: Negative.   Cardiovascular: Negative.   Gastrointestinal: Negative.   Genitourinary: Negative.   Musculoskeletal: Negative.   Neurological: Negative.   Hematological: Negative.   Psychiatric/Behavioral: Negative.        Objective:   Physical Exam  Constitutional: She appears well-developed.  HENT:  Head: Normocephalic.  Neck: Normal range of motion.  Pulmonary/Chest: Effort normal.  Abdominal: Soft.  Musculoskeletal: Normal range of motion.  Neurological: She is alert.  Right and left breasts are swollen and hard; no red streaks to armpits.  Right breast has reddeded area in upper quadrant but no abscesses palpatedNo fever; no malaise, aches or chills.      Assessment:     1. Mastitis       Plan:     1. Patient to start with Keflex 4 times a day for 10 days; monitor for chills, fever, body aches.   2. Patient will see [facility] tomorrow; continue care plan established by LC.   Luna Kitchens

## 2018-09-13 NOTE — Patient Instructions (Signed)
Mastitis °Mastitis is redness, soreness, and puffiness (inflammation) in an area of the breast. It is often caused by an infection that occurs when bacteria enter the skin. The infection is often helped by antibiotic medicine. °Follow these instructions at home: °· Only take medicines as told by your doctor. °· If your doctor prescribed an antibiotic medicine, take it as told. Finish it even if you start to feel better. °· Do not wear a tight or underwire bra. Wear a soft support bra. °· Drink more fluids, especially if you have a fever. °· If you are breastfeeding: °? Keep emptying the breast. Your doctor can tell you if the milk is safe. Use a breast pump if you are told to stop nursing. °? Keep your nipples clean and dry. °? Empty the first breast before going to the other breast. Use a breast pump if your baby is not emptying your breast. °? If you go back to work, pump your breasts while at work. °? Avoid letting your breasts get overly filled with milk (engorged). °Contact a doctor if: °· You have pus-like fluid leaking from your breast. °· Your symptoms do not get better within 2 days. °Get help right away if: °· Your pain and puffiness are getting worse. °· Your pain is not helped by medicine. °· You have a red line going from your breast toward your armpit. °· You have a fever or lasting symptoms for more than 2-3 days. °· You have a fever and your symptoms suddenly get worse. °This information is not intended to replace advice given to you by your health care provider. Make sure you discuss any questions you have with your health care provider. °Document Released: 11/11/2009 Document Revised: 04/30/2016 Document Reviewed: 06/23/2013 °Elsevier Interactive Patient Education © 2017 Elsevier Inc. ° °

## 2018-09-13 NOTE — Lactation Note (Signed)
This note was copied from a baby's chart. Lactation Consultation Note  Patient Name: Chelsey Torres [id number] Date: 09/13/2018 Reason for consult: Follow-up assessment;NICU baby;Infant weight loss;Preterm <34wks;Primapara;1st time breastfeeding;Infant < 6lbs  Visited with mom of 4 days old < 6 lbs NICU female. She's a P1 and had some questions about BF. When meeting with mom, she mentioned that her breast were getting full, hard and this morning the right one turned "red". LC and mom went to pumping room for breast examination. Both breast look full, hard and not even lumpy now the entire breast area feels just very hard like a mass. Mom voiced she knew that there were going to be some breast changes once her milk came in but she didn't know what was normal and what was not.  Her milk came in on Sunday oct. 6th and her breast felt firm that day. The following day on Monday, her breast felt very full, she probably got engorged by then. Now this morning she noticed some redness on her right breast, the left one still looks engorged but no redness observed upon examination, both nipples still looking intact. When LC tried to hand express, mom voiced it was painful, LC did some negative pressure techniques to relieve the areola edema. Mom needs to get some breast shells, unable to do so now because she was getting ready to leave, but she's aware that they're part of the plan.  Mom is pumping every 3 hours for 15 minutes as she was told and she's also no letting go more than 6 hours at night in between pumping sessions. She'll be back at 5 pm, LC called mom's mobile number to let her know that she needs to contact her OBGYN to be seen ASAP for a possible developing mastitis. Mom said she'll call her OB right away and will touch base with LC when she comes back to the NICU at 5 pm. LC will be issuing the breast shells then.  Plan:  1. Mom will see her OB ASAP to out rule an episode of mastitis 2. Mom will  start doing ice prior every pumping session, 4 packs of frozen ice packs (or frozen vegetables) for 20 minutes prior doing the warm compresses (moist heat) 3. She'll continue pumping every 3 hours and at least once at night not letting go more than 6 hours at night between pumping sessions. 4. She'll consider scheduling a LC consultation to be seen later this week if still needed  Mom reported all questions were answered, she's aware of LC services and will call PRN.   Maternal Data    Feeding Feeding Type: Donor Breast Milk Nipple Type: Nfant Extra Slow Flow (gold)  Interventions Interventions: Breast feeding basics reviewed;Breast massage;Hand express;Reverse pressure;Breast compression;Expressed milk  Lactation Tools Discussed/Used     Consult Status Consult Status: Follow-up Date: 09/13/18 Follow-up type: In-patient    Marshella Tello Venetia Constable 09/13/2018, 1:20 PM

## 2018-09-19 ENCOUNTER — Ambulatory Visit: Payer: Self-pay

## 2018-09-19 NOTE — Lactation Note (Addendum)
This note was copied from a baby's chart. Lactation Consultation Note  Patient Name: Chelsey Torres [id number] Date: 09/19/2018 Reason for consult: Follow-up assessment;Infant < 6lbs;Preterm <34wks;NICU baby   Follow up with  Mom of 65 day old NICU infant. Mom is concerned her milk supply has decreased over the last few days. She has been pumping about every 3 hours until last night when she slept 10 hours. Enc mom to pump at least every 3 hours with 5 hour stretch at night with no pumping. Enc mom to perform hands on pumping and hand expression post pumping. Enc mom to pump after holding infant and Enc STS when mom and infant is able. Mom denied new meds except for Antibiotics.   Mom reports she is being treated with Mastitis currently. She reports she is not eating or drinking well. Enc mom to increase fluids and calories for milk production, mom voiced understanding. Mom is to let us know in a few days if she is not getting any more supply to discuss herbs that may be helpful. She reports understanding. Mom has purchased Mother's Milk Tea to try today.   Mom reports no further questions/concerns at this time.    Maternal Data Has patient been taught Hand Expression?: Yes  Feeding Feeding Type: Formula Nipple Type: Nfant Extra Slow Flow (gold)  LATCH Score                   Interventions    Lactation Tools Discussed/Used Pump Review: Setup, frequency, and cleaning Initiated by:: reviewed and encouraged 8+ times a day   Consult Status Consult Status: PRN Follow-up type: Call as needed    Ed Blalock 09/19/2018, 1:29 PM

## 2018-09-24 IMAGING — US US MFM FETAL BPP W/O NON-STRESS
1 series · 14 of 28 positions shown · non-contrast
Comparison: none

[Series 2: us mfm fetal bpp w/o non-stress · 49 acquisitions, 14 frames shown]
[im 2/49]
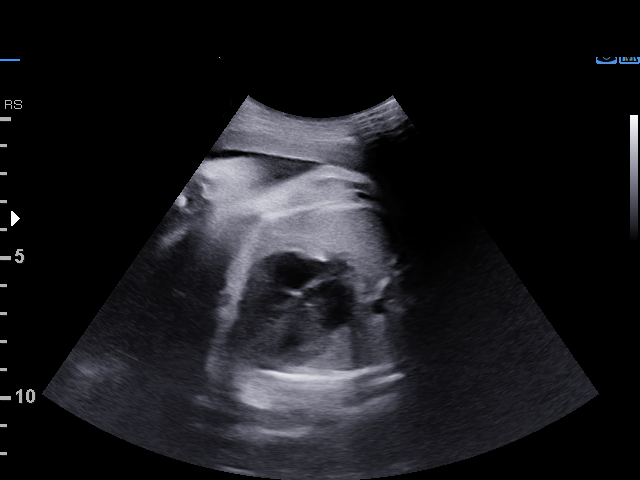
[im 6/49]
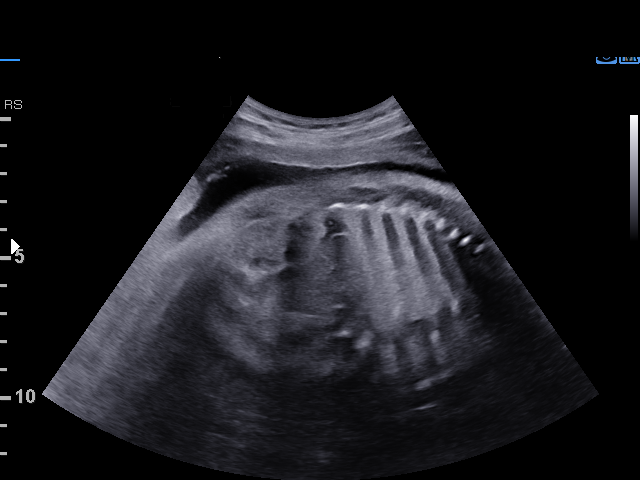
[im 9/49]
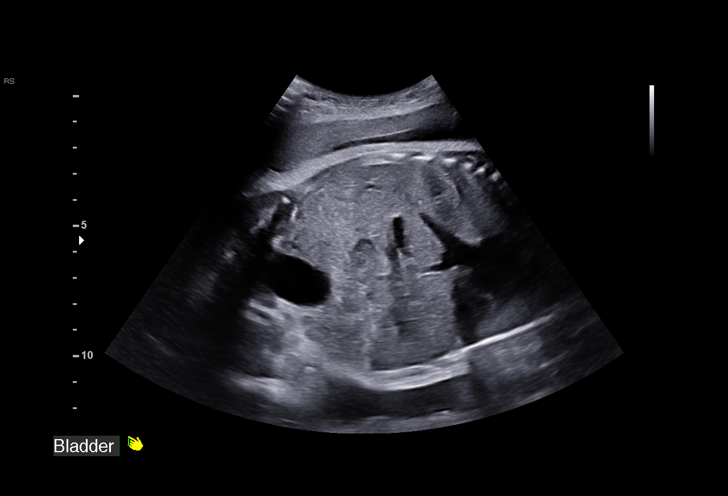
[im 13/49]
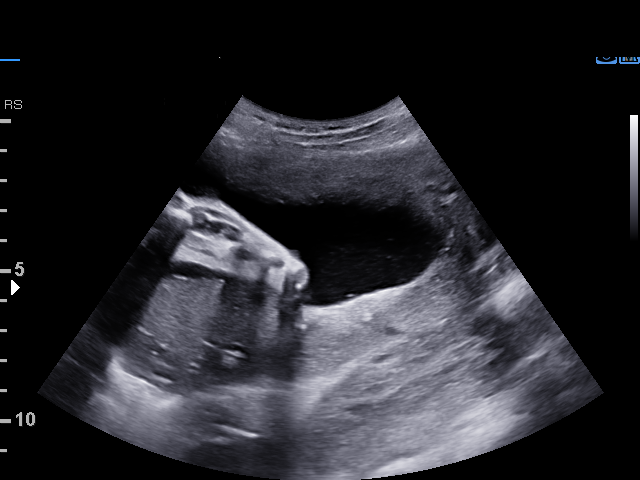
[im 17/49]
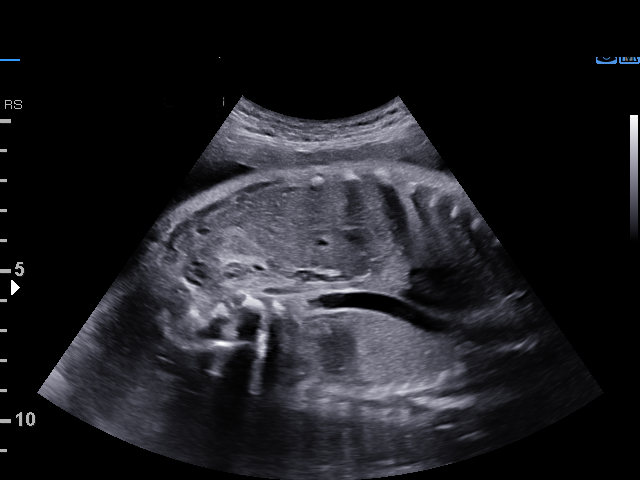
[im 20/49]
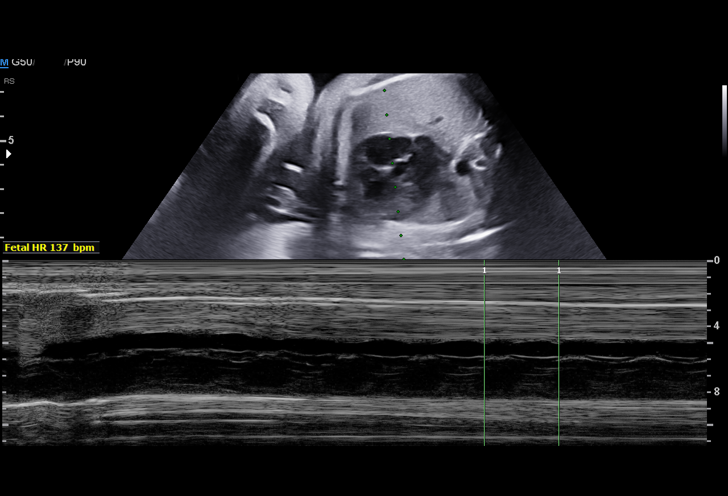
[im 24/49]
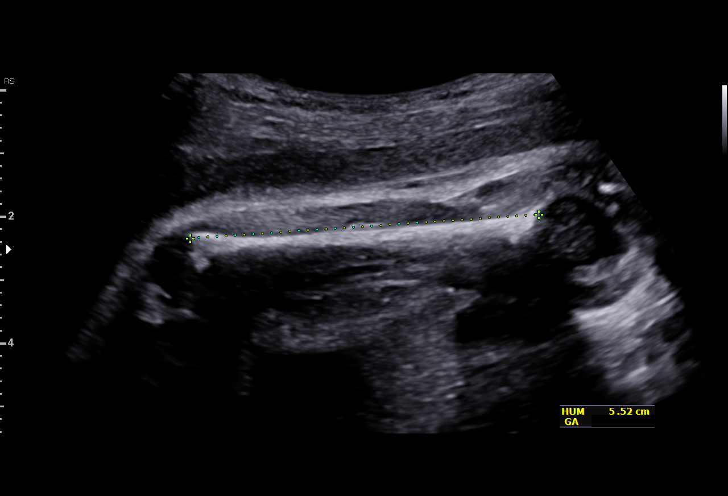
[im 27/49]
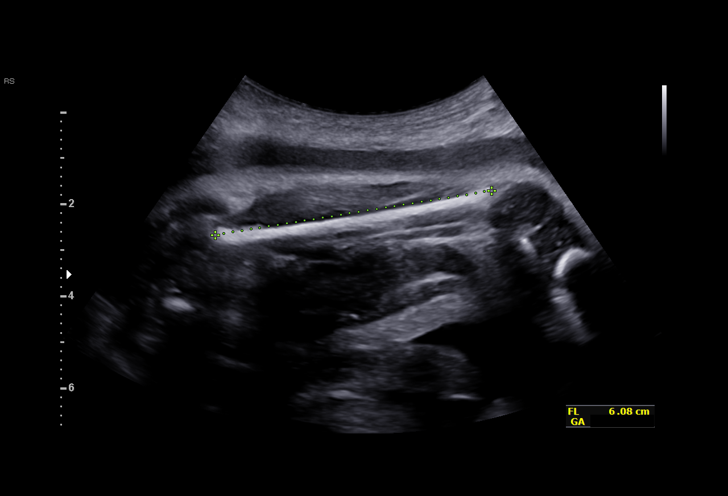
[im 31/49]
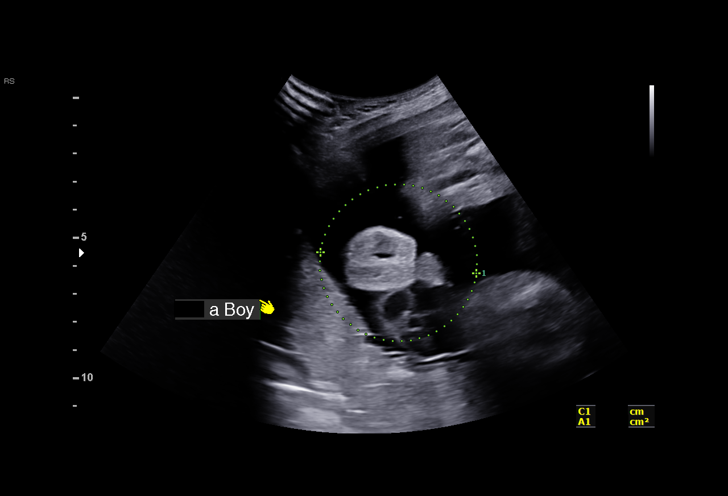
[im 34/49]
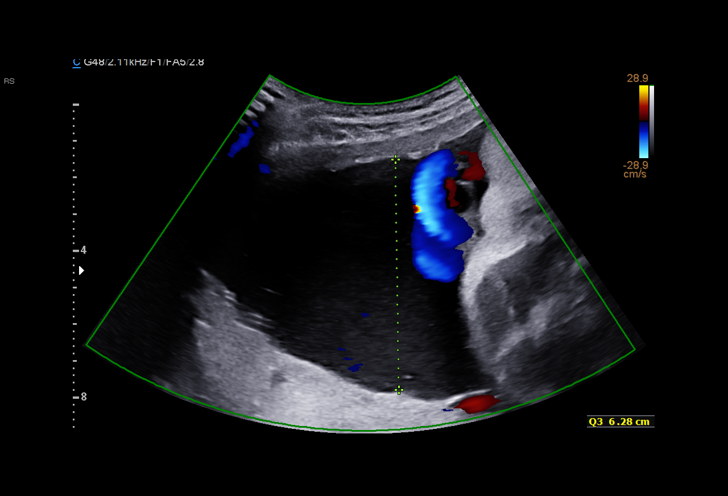
[im 38/49]
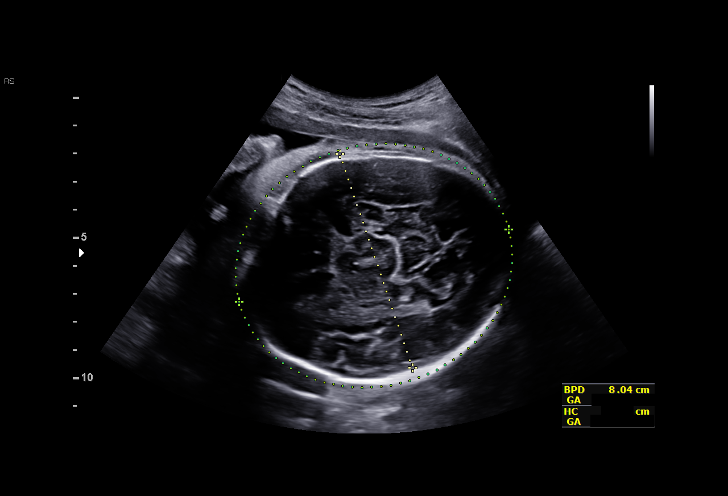
[im 41/49]
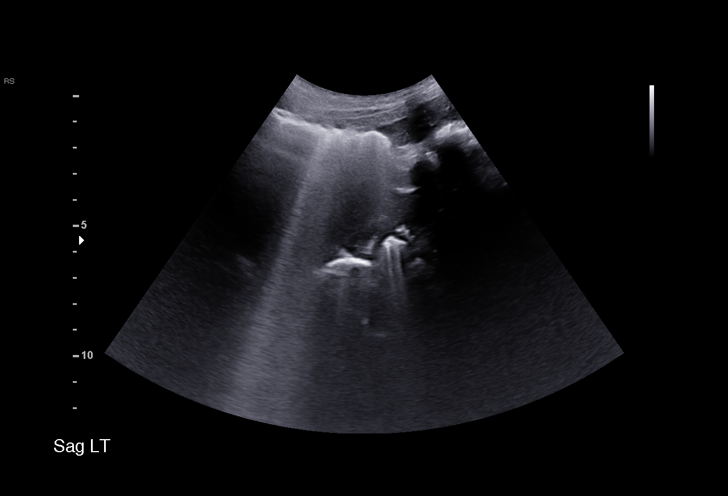
[im 45/49]
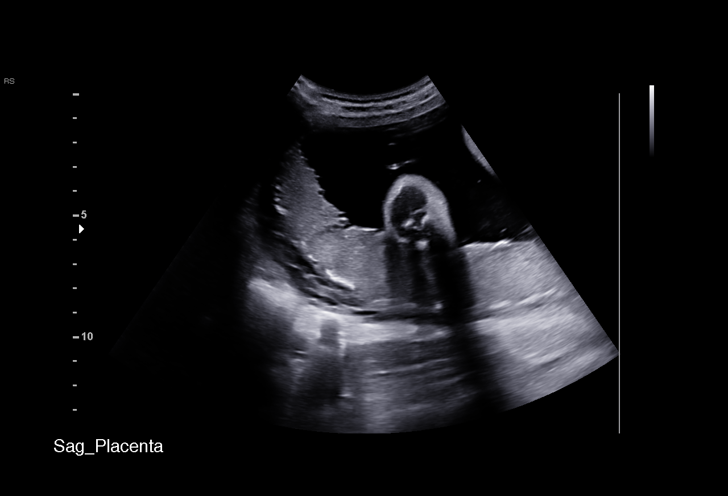
[im 49/49]
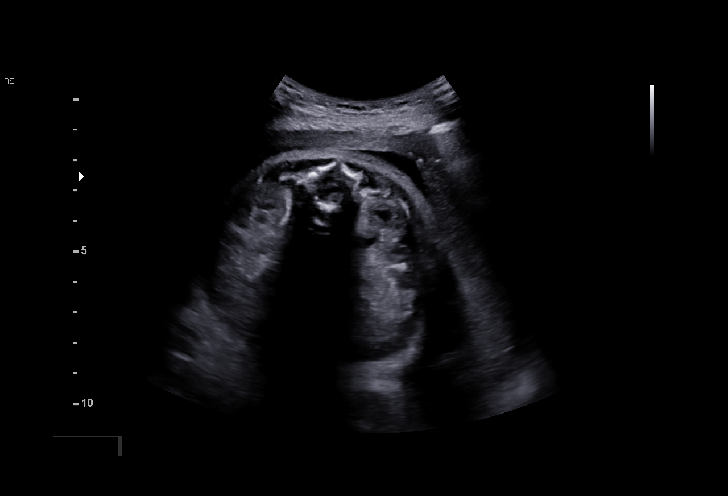

[14 of 28 positions shown; findings below may reference images not displayed]

OB/Gyn Clinic
[REDACTED] [REDACTED][HOSPITAL]

Indications

32 weeks gestation of pregnancy
Drug use complicating pregnancy, second
trimester (methadone)
Antenatal follow-up for nonvisualized fetal
anatomy
Vital Signs

BMI:
Fetal Evaluation

Num Of Fetuses:         1
Fetal Heart Rate(bpm):  130
Cardiac Activity:       Observed
Presentation:           Cephalic
Placenta:               Posterior
P. Cord Insertion:      Previously Visualized
Amniotic Fluid
AFI FV:      Within normal limits

AFI Sum(cm)     %Tile       Largest Pocket(cm)
23.93           93

RUQ(cm)       RLQ(cm)       LUQ(cm)        LLQ(cm)
5.62
Biophysical Evaluation

Amniotic F.V:   Pocket => 2 cm two         F. Tone:        Observed
planes
F. Movement:    Observed                   Score:          [DATE]
F. Breathing:   Observed
Biometry

BPD:      79.8  mm     G. Age:  32w 0d         34  %    CI:         75.2   %    70 - 86
FL/HC:      20.9   %    19.1 -
HC:      291.9  mm     G. Age:  32w 1d         14  %    HC/AC:      0.99        0.96 -
AC:      296.3  mm     G. Age:  33w 4d         83  %    FL/BPD:     76.4   %    71 - 87
FL:         61  mm     G. Age:  31w 5d         23  %    FL/AC:      20.6   %    20 - 24
HUM:      55.2  mm     G. Age:  32w 1d         49  %

Est. FW:    5317  gm      4 lb 8 oz     66  %
OB History

Gravidity:    3         Term:   0        Prem:   0        SAB:   0
TOP:          2       Ectopic:  0        Living: 0
Gestational Age

U/S Today:     32w 3d                                        EDD:   10/22/18
Best:          32w 2d     Det. By:  U/S  (05/27/18)          EDD:   10/23/18
Anatomy

Cranium:               Appears normal         Aortic Arch:            Previously seen
Cavum:                 Previously seen        Ductal Arch:            Previously seen
Ventricles:            Appears normal         Diaphragm:              Previously seen
Choroid Plexus:        Previously seen        Stomach:                Appears normal, left
sided
Cerebellum:            Previously seen        Abdomen:                Appears normal
Posterior Fossa:       Previously seen        Abdominal Wall:         Previously seen
Nuchal Fold:           Not applicable (>20    Cord Vessels:           Previously seen
wks GA)
Face:                  Orbits and profile     Kidneys:                Appear normal
previously seen
Lips:                  Previously seen        Bladder:                Appears normal
Thoracic:              Appears normal         Spine:                  Previously seen
Heart:                 Appears normal         Upper Extremities:      Previously seen
(4CH, axis, and situs
RVOT:                  Previously seen        Lower Extremities:      Previously seen
LVOT:                  Appears normal

Other:  Male gender. Heels and RT 5th digit prev visualized. Technically
difficult due to fetal position.
Cervix Uterus Adnexa

Cervix
Not visualized (advanced GA >45wks)

Uterus
No abnormality visualized.

Left Ovary
Not visualized.

Right Ovary
Not visualized.

Adnexa
No abnormality visualized. No adnexal mass
visualized.
Impression

Patient returned for fetal growth assessment.

On ultrasound, fetal bradycardia was noted (to 50 bpm) for
almost a minute. I explained the urgency and we transferred
the patient to the [facility] for monitoring. Subsequently, NST
remained reactive with recovery of fetal heart rate to normal.

Patient was brought back later for fetal growth and BPP.
Fetal growth is appropriate for gestational age. Amniotic fluid
is normal and good fetal activity is seen. BPP [DATE].

Patient was later discharged from the [facility].
Recommendations

Fetal growth assessment in 4 weeks.

## 2018-10-05 ENCOUNTER — Ambulatory Visit: Payer: Medicaid Other | Admitting: Obstetrics and Gynecology

## 2018-10-06 ENCOUNTER — Ambulatory Visit: Payer: Self-pay | Admitting: Clinical

## 2018-10-06 ENCOUNTER — Encounter: Payer: Self-pay | Admitting: Family Medicine

## 2018-10-06 ENCOUNTER — Ambulatory Visit (INDEPENDENT_AMBULATORY_CARE_PROVIDER_SITE_OTHER): Payer: Medicaid Other | Admitting: Family Medicine

## 2018-10-06 DIAGNOSIS — R87612 Low grade squamous intraepithelial lesion on cytologic smear of cervix (LGSIL): Secondary | ICD-10-CM

## 2018-10-06 DIAGNOSIS — Z8632 Personal history of gestational diabetes: Secondary | ICD-10-CM

## 2018-10-06 DIAGNOSIS — R768 Other specified abnormal immunological findings in serum: Secondary | ICD-10-CM

## 2018-10-06 DIAGNOSIS — Z1389 Encounter for screening for other disorder: Secondary | ICD-10-CM | POA: Diagnosis not present

## 2018-10-06 DIAGNOSIS — Z658 Other specified problems related to psychosocial circumstances: Secondary | ICD-10-CM

## 2018-10-06 NOTE — Progress Notes (Signed)
Subjective:     Chelsey Torres is a 32 y.o. female who presents for a postpartum visit. She is 5 weeks postpartum following a spontaneous vaginal delivery. I have fully reviewed the prenatal and intrapartum course. The delivery was at 33.4 gestational weeks. Outcome: spontaneous vaginal delivery. Anesthesia: epidural. Postpartum course has been unremarkable. Baby's course has been unremarkable. Baby is feeding by both breast and bottle - Similac Neosure. Bleeding: no bleeding. Bowel function is normal. Bladder function is normal. Patient is not sexually active. Contraception method is Nexplanon. Postpartum depression screening: negative.   States mastitis is doing much better, completed course of antibiotics. Would prefer to come back for Nexplanon insertion. Not currently sexually active. Meeting with behavioral health today. Infant is going to be able to leave the NICU on Sunday.   The following portions of the patient's history were reviewed and updated as appropriate: allergies, current medications, past family history, past medical history, past social history, past surgical history and problem list.  Review of Systems Pertinent items are noted in HPI.   Objective:    BP 100/70   Pulse (!) 56   Wt 115 lb 14.4 oz (52.6 kg)   BMI 19.29 kg/m   General:  alert and cooperative, NAD   Breasts:  deferred  Lungs: clear to auscultation bilaterally  Heart:  regular rate and rhythm, S1, S2 normal, no murmur, click, rub or gallop  Abdomen: soft, non-tender   GU:  not evaluated  LEs:  no swelling, edema         Assessment:   Routine postpartum exam. Pap smear not done at today's visit, as not yet due for repeat.   Plan:   1. Contraception: planning Nexplanon, will schedule insertion as would prefer not to have done today 2. LSIL, HPV(-): Needs repeat Pap June 2020. Denies history of ever having abnormal Pap smear.  3. History of Polysubstance Use, Methadone Maintenance: Stable, meeting with  behavioral health specialist today.  4. 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 Needs repeat RNA testing and if positive, should have referral to hepatology.  5. History of Gestational Diabetes: Not on medications, only elevated fasting BG. Not fasting today. Encouraged to have A1C or 2hr checked when returns for Nexplanon.   Cristal Deer. Earlene Plater, DO OB/GYN Fellow

## 2018-10-06 NOTE — BH Specialist Note (Signed)
Integrated Behavioral Health Initial Visit  MRN: [id number] Name: Chelsey Torres  Number of Integrated Behavioral Health Clinician visits:: 1/6 Session Start time: 2:15  Session End time: 2:30 Total time: 15 minutes  Type of Service: Integrated Behavioral Health- Individual/Family Interpretor:No. Interpretor Name and Language: n/a   Warm Hand Off Completed.       SUBJECTIVE: Chelsey Torres is a 32 y.o. female accompanied by n/a Patient was referred by Rhett Bannister, DO for postpartum check Patient reports the following symptoms/concerns: Pt states she is continuing to attend therapy with [facility], and has good family support. Pt's primary concern today is worry over future custody/legal issues with FOB.  Duration of problem: Postpartum; Severity of problem: mild  OBJECTIVE: Mood: Normal and Affect: Appropriate Risk of harm to self or others: No plan to harm self or others  LIFE CONTEXT: Family and Social: Pt lives with family; baby is in NICU School/Work: - Self-Care: Attends routine therapy  Life Changes: Recent childbirth  GOALS ADDRESSED: Patient will: 1. Reduce symptoms of: stress 2. Increase knowledge and/or ability of: stress reduction  3. Demonstrate ability to: Increase healthy adjustment to current life circumstances and Increase adequate support systems for patient/family  INTERVENTIONS: Interventions utilized: Supportive Counseling, Psychoeducation and/or Health Education and Link to [employer]  Standardized Assessments completed: GAD-7 and PHQ 9  ASSESSMENT: Patient currently experiencing Psychosocial stressors.    Patient may benefit from psychoeducation and brief therapeutic interventions regarding coping with life stress .  PLAN: 1. Follow up with behavioral health clinician on : As needed 2. Behavioral recommendations:  -Continue attending routine therapy at [facility] -Attend at least one Mom Talk or Baby  and Me support group when baby is released from NICU -Set up resource counseling appointment with [facility]; utilize legal resources  3. Referral(s): Integrated Art gallery manager (In Clinic) and [employer]:  Legal 4. "From scale of 1-10, how likely are you to follow plan?": 10  BENNETT VANSCYOC, LCSW  Depression screen [facility] 2/9 10/06/2018 06/15/2018  Decreased Interest 0 1  Down, Depressed, Hopeless 0 1  PHQ - 2 Score 0 2  Altered sleeping 1 1  Tired, decreased energy 0 1  Change in appetite 1 0  Feeling bad or failure about yourself  0 0  Trouble concentrating 0 0  Moving slowly or fidgety/restless 0 0  Suicidal thoughts 0 0  PHQ-9 Score 2 4   GAD 7 : Generalized Anxiety Score 10/06/2018 06/15/2018  Nervous, Anxious, on Edge 0 1  Control/stop worrying 2 1  Worry too much - different things 1 1  Trouble relaxing 0 0  Restless 0 0  Easily annoyed or irritable 0 1  Afraid - awful might happen 0 0  Total GAD 7 Score 3 4

## 2018-10-06 NOTE — Patient Instructions (Addendum)
You will need a repeat Pap smear in June 2020  Etonogestrel implant What is this medicine? ETONOGESTREL (et oh noe JES trel) is a contraceptive (birth control) device. It is used to prevent pregnancy. It can be used for up to 3 years. This medicine may be used for other purposes; ask your health care provider or pharmacist if you have questions. COMMON BRAND NAME(S): Implanon, Nexplanon What should I tell my health care provider before I take this medicine? They need to know if you have any of these conditions: -abnormal vaginal bleeding -blood vessel disease or blood clots -cancer of the breast, cervix, or liver -depression -diabetes -gallbladder disease -headaches -heart disease or recent heart attack -high blood pressure -high cholesterol -kidney disease -liver disease -renal disease -seizures -tobacco smoker -an unusual or allergic reaction to etonogestrel, other hormones, anesthetics or antiseptics, medicines, foods, dyes, or preservatives -pregnant or trying to get pregnant -breast-feeding How should I use this medicine? This device is inserted just under the skin on the inner side of your upper arm by a health care professional. Talk to your pediatrician regarding the use of this medicine in children. Special care may be needed. Overdosage: If you think you have taken too much of this medicine contact a poison control center or emergency room at once. NOTE: This medicine is only for you. Do not share this medicine with others. What if I miss a dose? This does not apply. What may interact with this medicine? Do not take this medicine with any of the following medications: -amprenavir -bosentan -fosamprenavir This medicine may also interact with the following medications: -barbiturate medicines for inducing sleep or treating seizures -certain medicines for fungal infections like ketoconazole and itraconazole -grapefruit juice -griseofulvin -medicines to treat  seizures like carbamazepine, felbamate, oxcarbazepine, phenytoin, topiramate -modafinil -phenylbutazone -rifampin -rufinamide -some medicines to treat HIV infection like atazanavir, indinavir, lopinavir, nelfinavir, tipranavir, ritonavir -St. John's wort This list may not describe all possible interactions. Give your health care provider a list of all the medicines, herbs, non-prescription drugs, or dietary supplements you use. Also tell them if you smoke, drink alcohol, or use illegal drugs. Some items may interact with your medicine. What should I watch for while using this medicine? This product does not protect you against HIV infection (AIDS) or other sexually transmitted diseases. You should be able to feel the implant by pressing your fingertips over the skin where it was inserted. Contact your doctor if you cannot feel the implant, and use a non-hormonal birth control method (such as condoms) until your doctor confirms that the implant is in place. If you feel that the implant may have broken or become bent while in your arm, contact your healthcare provider. What side effects may I notice from receiving this medicine? Side effects that you should report to your doctor or health care professional as soon as possible: -allergic reactions like skin rash, itching or hives, swelling of the face, lips, or tongue -breast lumps -changes in emotions or moods -depressed mood -heavy or prolonged menstrual bleeding -pain, irritation, swelling, or bruising at the insertion site -scar at site of insertion -signs of infection at the insertion site such as fever, and skin redness, pain or discharge -signs of pregnancy -signs and symptoms of a blood clot such as breathing problems; changes in vision; chest pain; severe, sudden headache; pain, swelling, warmth in the leg; trouble speaking; sudden numbness or weakness of the face, arm or leg -signs and symptoms of liver injury like dark  yellow or brown  urine; general ill feeling or flu-like symptoms; light-colored stools; loss of appetite; nausea; right upper belly pain; unusually weak or tired; yellowing of the eyes or skin -unusual vaginal bleeding, discharge -signs and symptoms of a stroke like changes in vision; confusion; trouble speaking or understanding; severe headaches; sudden numbness or weakness of the face, arm or leg; trouble walking; dizziness; loss of balance or coordination Side effects that usually do not require medical attention (report to your doctor or health care professional if they continue or are bothersome): -acne -back pain -breast pain -changes in weight -dizziness -general ill feeling or flu-like symptoms -headache -irregular menstrual bleeding -nausea -sore throat -vaginal irritation or inflammation This list may not describe all possible side effects. Call your doctor for medical advice about side effects. You may report side effects to FDA at 1-800-FDA-1088. Where should I keep my medicine? This drug is given in a hospital or clinic and will not be stored at home. NOTE: This sheet is a summary. It may not cover all possible information. If you have questions about this medicine, talk to your doctor, pharmacist, or health care provider.  2018 [employer]vier/Gold Standard (2016-06-11 11:19:22)

## 2018-10-11 ENCOUNTER — Telehealth: Payer: Self-pay | Admitting: General Practice

## 2018-10-11 NOTE — Telephone Encounter (Signed)
Patient called & left message on nurse voicemail line stating her baby has been in the NICU and is about to be discharge. Patient states she has some questions about circumcision. Called patient, no answer- left message stating we are trying to reach you to return your phone call, please call us back if you still need assistance.

## 2018-10-12 ENCOUNTER — Telehealth: Payer: Self-pay | Admitting: General Practice

## 2018-10-12 NOTE — Telephone Encounter (Signed)
Patient called and left message on nurse voicemail line stating she has questions about circumcision and requests call back today before 1630. Called patient, no answer- left message stating we are trying to reach you to return your phone call, please call us back if you still need assistance.

## 2018-10-19 ENCOUNTER — Ambulatory Visit: Payer: Self-pay | Admitting: Obstetrics and Gynecology

## 2019-02-09 ENCOUNTER — Ambulatory Visit (HOSPITAL_COMMUNITY)
Admission: EM | Admit: 2019-02-09 | Discharge: 2019-02-09 | Disposition: A | Discharge status: Home / Self Care | Payer: Medicaid Other | Attending: Family Medicine | Admitting: Family Medicine

## 2019-02-09 ENCOUNTER — Other Ambulatory Visit: Payer: Self-pay

## 2019-02-09 ENCOUNTER — Encounter (HOSPITAL_COMMUNITY): Payer: Self-pay

## 2019-02-09 DIAGNOSIS — J069 Acute upper respiratory infection, unspecified: Secondary | ICD-10-CM | POA: Diagnosis not present

## 2019-02-09 DIAGNOSIS — J9801 Acute bronchospasm: Secondary | ICD-10-CM

## 2019-02-09 MED ORDER — BENZONATATE 100 MG PO CAPS: ORAL_CAPSULE | ORAL | 0 refills | Status: AC

## 2019-02-09 MED ORDER — PREDNISONE 10 MG (21) PO TBPK: ORAL_TABLET | Freq: Every day | ORAL | 0 refills | Status: AC

## 2019-02-09 NOTE — ED Triage Notes (Signed)
Pt cc cough and thick yellowish in color mucus this started yesterday. Pt states she's been wheezing and has chest congestion.

## 2019-02-09 NOTE — ED Provider Notes (Signed)
[facility]   [id number] 02/09/19 Arrival Time: 1044  ASSESSMENT & PLAN:  1. Acute upper respiratory infection   2. Bronchospasm, acute    No indication for chest imaging at this time. Discussed.  Meds ordered this encounter  Medications  . predniSONE (STERAPRED UNI-PAK 21 TAB) 10 MG (21) TBPK tablet    Sig: Take by mouth daily. Take as directed.    Dispense:  21 tablet    Refill:  0  . benzonatate (TESSALON) 100 MG capsule    Sig: Take 1 capsule by mouth every 8 (eight) hours for cough.    Dispense:  21 capsule    Refill:  0   Work note provided. Discussed typical duration of symptoms. OTC symptom care as needed. Ensure adequate fluid intake and rest. May f/u with PCP or here as needed.  Reviewed expectations re: course of current medical issues. Questions answered. Outlined signs and symptoms indicating need for more acute intervention. Patient verbalized understanding. After Visit Summary given.   SUBJECTIVE: History from: patient.  Chelsey Torres is a 33 y.o. female who presents with complaint of mild nasal congestion and a persistent cough productive of yellow mucous; without sore throat. Onset abrupt, yesterday; with mild fatigue and without body aches. SOB: none. Wheezing: moderate and sporadic, esp with coughing. No CP reported. Fever: none suspected. Overall normal PO intake without n/v. Known sick contacts: no. No specific or significant aggravating or alleviating factors reported. OTC treatment: none reported.  Social History   Tobacco Use  Smoking Status Current Some Day Smoker  . Packs/day: 0.50  . Years: 10.00  . Pack years: 5.00  . Types: Cigarettes  Smokeless Tobacco Never Used    ROS: As per HPI. All other systems negative.   OBJECTIVE:  Vitals:   02/09/19 1101 02/09/19 1105  BP: 99/60   Pulse: 71   Resp: 16   Temp: 98.8 F (37.1 C)   TempSrc: Oral   SpO2: 100%   Weight:  52.2 kg     General appearance: alert; appears  fatigued HEENT: nasal congestion; clear runny nose; throat irritation secondary to post-nasal drainage Neck: supple without LAD CV: RRR Lungs: unlabored respirations, symmetrical air entry with significant expiratory wheezing wheezing; cough: moderate Abd: soft Ext: no LE edema Skin: warm and dry Psychological: alert and cooperative; normal mood and affect   Allergies  Allergen Reactions  . Tylenol [Acetaminophen] Hives    Large doses    Past Medical History:  Diagnosis Date  . Anxiety   . Depression   . Genital herpes   . Mastitis 09/13/2018  . Preterm delivery, delivered 09/07/2018  . Substance abuse (HCC)    Family History  Problem Relation Age of Onset  . Asthma Brother    Social History   Socioeconomic History  . Marital status: Single    Spouse name: Not on file  . Number of children: Not on file  . Years of education: Not on file  . Highest education level: Not on file  Occupational History  . Not on file  Social Needs  . Financial resource strain: Not on file  . Food insecurity:    Worry: Not on file    Inability: Not on file  . Transportation needs:    Medical: Not on file    Non-medical: Not on file  Tobacco Use  . Smoking status: Current Some Day Smoker    Packs/day: 0.50    Years: 10.00    Pack years: 5.00  Types: Cigarettes  . Smokeless tobacco: Never Used  Substance and Sexual Activity  . Alcohol use: Yes    Comment: socially  . Drug use: Yes    Frequency: 0.2 times per week    Types: Marijuana    Comment: methadone, previous pain pills and heroin  . Sexual activity: Not Currently    Birth control/protection: None  Lifestyle  . Physical activity:    Days per week: Not on file    Minutes per session: Not on file  . Stress: Not on file  Relationships  . Social connections:    Talks on phone: Not on file    Gets together: Not on file    Attends religious service: Not on file    Active member of club or organization: Not on file     Attends meetings of clubs or organizations: Not on file    Relationship status: Not on file  . Intimate partner violence:    Fear of current or ex partner: Not on file    Emotionally abused: Not on file    Physically abused: Not on file    Forced sexual activity: Not on file  Other Topics Concern  . Not on file  Social History Narrative  . Not on file           Mardella Layman, MD 02/09/19 1151

## 2021-01-26 ENCOUNTER — Inpatient Hospital Stay (HOSPITAL_COMMUNITY)
Admission: EM | Admit: 2021-01-26 | Discharge: 2021-02-04 | DRG: 871 | Disposition: E | Payer: Medicaid Other | Attending: Internal Medicine | Admitting: Internal Medicine

## 2021-01-26 ENCOUNTER — Emergency Department (HOSPITAL_COMMUNITY): Payer: Medicaid Other

## 2021-01-26 DIAGNOSIS — I330 Acute and subacute infective endocarditis: Secondary | ICD-10-CM | POA: Diagnosis not present

## 2021-01-26 DIAGNOSIS — E871 Hypo-osmolality and hyponatremia: Secondary | ICD-10-CM | POA: Diagnosis present

## 2021-01-26 DIAGNOSIS — J90 Pleural effusion, not elsewhere classified: Secondary | ICD-10-CM | POA: Diagnosis present

## 2021-01-26 DIAGNOSIS — R64 Cachexia: Secondary | ICD-10-CM | POA: Diagnosis present

## 2021-01-26 DIAGNOSIS — G9341 Metabolic encephalopathy: Secondary | ICD-10-CM | POA: Diagnosis present

## 2021-01-26 DIAGNOSIS — E8770 Fluid overload, unspecified: Secondary | ICD-10-CM | POA: Diagnosis present

## 2021-01-26 DIAGNOSIS — B1920 Unspecified viral hepatitis C without hepatic coma: Secondary | ICD-10-CM | POA: Diagnosis present

## 2021-01-26 DIAGNOSIS — Z79899 Other long term (current) drug therapy: Secondary | ICD-10-CM | POA: Diagnosis not present

## 2021-01-26 DIAGNOSIS — F1910 Other psychoactive substance abuse, uncomplicated: Secondary | ICD-10-CM

## 2021-01-26 DIAGNOSIS — R6521 Severe sepsis with septic shock: Secondary | ICD-10-CM | POA: Diagnosis not present

## 2021-01-26 DIAGNOSIS — R7401 Elevation of levels of liver transaminase levels: Secondary | ICD-10-CM

## 2021-01-26 DIAGNOSIS — E872 Acidosis, unspecified: Secondary | ICD-10-CM

## 2021-01-26 DIAGNOSIS — F17210 Nicotine dependence, cigarettes, uncomplicated: Secondary | ICD-10-CM | POA: Diagnosis present

## 2021-01-26 DIAGNOSIS — A419 Sepsis, unspecified organism: Principal | ICD-10-CM

## 2021-01-26 DIAGNOSIS — R768 Other specified abnormal immunological findings in serum: Secondary | ICD-10-CM

## 2021-01-26 DIAGNOSIS — R579 Shock, unspecified: Secondary | ICD-10-CM

## 2021-01-26 DIAGNOSIS — Z681 Body mass index (BMI) 19 or less, adult: Secondary | ICD-10-CM

## 2021-01-26 DIAGNOSIS — E162 Hypoglycemia, unspecified: Secondary | ICD-10-CM | POA: Diagnosis present

## 2021-01-26 DIAGNOSIS — Z7952 Long term (current) use of systemic steroids: Secondary | ICD-10-CM

## 2021-01-26 DIAGNOSIS — I959 Hypotension, unspecified: Secondary | ICD-10-CM

## 2021-01-26 DIAGNOSIS — Z20822 Contact with and (suspected) exposure to covid-19: Secondary | ICD-10-CM | POA: Diagnosis present

## 2021-01-26 DIAGNOSIS — R778 Other specified abnormalities of plasma proteins: Secondary | ICD-10-CM

## 2021-01-26 DIAGNOSIS — I38 Endocarditis, valve unspecified: Secondary | ICD-10-CM

## 2021-01-26 DIAGNOSIS — N179 Acute kidney failure, unspecified: Secondary | ICD-10-CM

## 2021-01-26 DIAGNOSIS — J80 Acute respiratory distress syndrome: Secondary | ICD-10-CM | POA: Diagnosis present

## 2021-01-26 DIAGNOSIS — Z66 Do not resuscitate: Secondary | ICD-10-CM | POA: Diagnosis present

## 2021-01-26 LAB — RESP PANEL BY RT-PCR (FLU A&B, COVID) ARPGX2
Influenza A by PCR: NEGATIVE
Influenza B by PCR: NEGATIVE
SARS Coronavirus 2 by RT PCR: NEGATIVE

## 2021-01-26 LAB — CBC WITH DIFFERENTIAL/PLATELET
Basophils Absolute: 0 10*3/uL (ref 0.0–0.1)
Basophils Relative: 0 %
Eosinophils Absolute: 0 10*3/uL (ref 0.0–0.5)
Eosinophils Relative: 0 %
HCT: 45.3 % (ref 36.0–46.0)
Hemoglobin: 13.8 g/dL (ref 12.0–15.0)
Lymphocytes Relative: 33 %
Lymphs Abs: 1.2 10*3/uL (ref 0.7–4.0)
MCH: 28.8 pg (ref 26.0–34.0)
MCHC: 30.5 g/dL (ref 30.0–36.0)
MCV: 94.4 fL (ref 80.0–100.0)
Monocytes Absolute: 0.7 10*3/uL (ref 0.1–1.0)
Monocytes Relative: 20 %
Myelocytes: 8 %
Neutro Abs: 1.1 10*3/uL — ABNORMAL LOW (ref 1.7–7.7)
Neutrophils Relative %: 30 %
Platelets: 70 10*3/uL — ABNORMAL LOW (ref 150–400)
Promyelocytes Relative: 9 %
RBC: 4.8 MIL/uL (ref 3.87–5.11)
RDW: 15 % (ref 11.5–15.5)
WBC Morphology: ABNORMAL
WBC: 3.7 10*3/uL — ABNORMAL LOW (ref 4.0–10.5)
nRBC: 0 % (ref 0.0–0.2)

## 2021-01-26 LAB — LIPASE, BLOOD: Lipase: 20 U/L (ref 11–51)

## 2021-01-26 LAB — TROPONIN I (HIGH SENSITIVITY)
Troponin I (High Sensitivity): 209 ng/L (ref <18)
Troponin I (High Sensitivity): 367 ng/L (ref <18)

## 2021-01-26 LAB — BLOOD GAS, VENOUS
FIO2: 100
O2 Saturation: 12.5 %
Patient temperature: 36
pCO2, Ven: 106 mmHg (ref 44.0–60.0)
pH, Ven: <6.8 — CL (ref 7.250–7.430)
pO2, Ven: <31 mmHg — CL (ref 32.0–45.0)

## 2021-01-26 LAB — COMPREHENSIVE METABOLIC PANEL
ALT: 83 U/L — ABNORMAL HIGH (ref 0–44)
AST: 168 U/L — ABNORMAL HIGH (ref 15–41)
Albumin: 2.5 g/dL — ABNORMAL LOW (ref 3.5–5.0)
Alkaline Phosphatase: 127 U/L — ABNORMAL HIGH (ref 38–126)
Anion gap: 29 — ABNORMAL HIGH (ref 5–15)
BUN: 68 mg/dL — ABNORMAL HIGH (ref 6–20)
CO2: 10 mmol/L — ABNORMAL LOW (ref 22–32)
Calcium: 9.3 mg/dL (ref 8.9–10.3)
Chloride: 91 mmol/L — ABNORMAL LOW (ref 98–111)
Creatinine, Ser: 5.98 mg/dL — ABNORMAL HIGH (ref 0.44–1.00)
GFR, Estimated: 9 mL/min — ABNORMAL LOW (ref >60)
Glucose, Bld: 87 mg/dL (ref 70–99)
Potassium: 5.6 mmol/L — ABNORMAL HIGH (ref 3.5–5.1)
Sodium: 130 mmol/L — ABNORMAL LOW (ref 135–145)
Total Bilirubin: 4.8 mg/dL — ABNORMAL HIGH (ref 0.3–1.2)
Total Protein: 6.3 g/dL — ABNORMAL LOW (ref 6.5–8.1)

## 2021-01-26 LAB — CBC
HCT: 32.4 % — ABNORMAL LOW (ref 36.0–46.0)
Hemoglobin: 10.4 g/dL — ABNORMAL LOW (ref 12.0–15.0)
MCH: 29.8 pg (ref 26.0–34.0)
MCHC: 32.1 g/dL (ref 30.0–36.0)
MCV: 92.8 fL (ref 80.0–100.0)
Platelets: 30 10*3/uL — ABNORMAL LOW (ref 150–400)
RBC: 3.49 MIL/uL — ABNORMAL LOW (ref 3.87–5.11)
RDW: 15.3 % (ref 11.5–15.5)
WBC: 1 10*3/uL — CL (ref 4.0–10.5)
nRBC: 0 % (ref 0.0–0.2)

## 2021-01-26 LAB — CREATININE, SERUM
Creatinine, Ser: 6.37 mg/dL — ABNORMAL HIGH (ref 0.44–1.00)
GFR, Estimated: 8 mL/min — ABNORMAL LOW (ref >60)

## 2021-01-26 LAB — URINALYSIS, ROUTINE W REFLEX MICROSCOPIC
Bilirubin Urine: NEGATIVE
Glucose, UA: 150 mg/dL — AB
Ketones, ur: NEGATIVE mg/dL
Nitrite: NEGATIVE
Protein, ur: >=300 mg/dL — AB
Specific Gravity, Urine: 1.015 (ref 1.005–1.030)
Trans Epithel, UA: 3
WBC, UA: >50 WBC/hpf — ABNORMAL HIGH (ref 0–5)
pH: 6 (ref 5.0–8.0)

## 2021-01-26 LAB — BLOOD GAS, ARTERIAL
Acid-base deficit: 19.7 mmol/L — ABNORMAL HIGH (ref 0.0–2.0)
Bicarbonate: 8.3 mmol/L — ABNORMAL LOW (ref 20.0–28.0)
FIO2: 100
O2 Saturation: 58.5 %
Patient temperature: 34.9
pCO2 arterial: 46.7 mmHg (ref 32.0–48.0)
pH, Arterial: 6.952 — CL (ref 7.350–7.450)
pO2, Arterial: 47.5 mmHg — ABNORMAL LOW (ref 83.0–108.0)

## 2021-01-26 LAB — RAPID URINE DRUG SCREEN, HOSP PERFORMED
Amphetamines: NOT DETECTED
Barbiturates: NOT DETECTED
Benzodiazepines: NOT DETECTED
Cocaine: NOT DETECTED
Opiates: NOT DETECTED
Tetrahydrocannabinol: NOT DETECTED

## 2021-01-26 LAB — CBG MONITORING, ED
Glucose-Capillary: 75 mg/dL (ref 70–99)
Glucose-Capillary: <10 mg/dL — CL (ref 70–99)
Glucose-Capillary: 121 mg/dL — ABNORMAL HIGH (ref 70–99)

## 2021-01-26 LAB — LACTIC ACID, PLASMA
Lactic Acid, Venous: >11 mmol/L (ref 0.5–1.9)
Lactic Acid, Venous: >11 mmol/L (ref 0.5–1.9)

## 2021-01-26 LAB — SALICYLATE LEVEL: Salicylate Lvl: <7 mg/dL — ABNORMAL LOW (ref 7.0–30.0)

## 2021-01-26 LAB — HIV ANTIBODY (ROUTINE TESTING W REFLEX): HIV Screen 4th Generation wRfx: NONREACTIVE

## 2021-01-26 LAB — ACETAMINOPHEN LEVEL: Acetaminophen (Tylenol), Serum: 18 ug/mL (ref 10–30)

## 2021-01-26 LAB — ETHANOL: Alcohol, Ethyl (B): <10 mg/dL (ref <10)

## 2021-01-26 MED ORDER — SODIUM CHLORIDE 0.9 % IV SOLN
1.0000 g | INTRAVENOUS | Status: DC
  Filled 2021-01-26 (×2): qty 1

## 2021-01-26 MED ORDER — METRONIDAZOLE IN NACL 5-0.79 MG/ML-% IV SOLN
500.0000 mg | Freq: Once | INTRAVENOUS | Status: AC
  Administered 2021-01-26: 500 mg via INTRAVENOUS
  Filled 2021-01-26: qty 100

## 2021-01-26 MED ORDER — NOREPINEPHRINE 4 MG/250ML-% IV SOLN
INTRAVENOUS | Status: AC
  Filled 2021-01-26: qty 250

## 2021-01-26 MED ORDER — LACTATED RINGERS IV BOLUS (SEPSIS)
500.0000 mL | Freq: Once | INTRAVENOUS | Status: AC
  Administered 2021-01-26: 500 mL via INTRAVENOUS

## 2021-01-26 MED ORDER — VANCOMYCIN VARIABLE DOSE PER UNSTABLE RENAL FUNCTION (PHARMACIST DOSING): Status: DC

## 2021-01-26 MED ORDER — LACTATED RINGERS IV BOLUS (SEPSIS)
250.0000 mL | Freq: Once | INTRAVENOUS | Status: AC
  Administered 2021-01-26: 250 mL via INTRAVENOUS

## 2021-01-26 MED ORDER — ETOMIDATE 2 MG/ML IV SOLN
20.0000 mg | Freq: Once | INTRAVENOUS | Status: AC
  Administered 2021-01-26: 20 mg via INTRAVENOUS

## 2021-01-26 MED ORDER — LACTATED RINGERS IV BOLUS (SEPSIS)
1000.0000 mL | Freq: Once | INTRAVENOUS | Status: AC
  Administered 2021-01-26: 1000 mL via INTRAVENOUS

## 2021-01-26 MED ORDER — LACTATED RINGERS IV SOLN: INTRAVENOUS | Status: DC

## 2021-01-26 MED ORDER — SODIUM CHLORIDE 0.9 % IV BOLUS
1000.0000 mL | Freq: Once | INTRAVENOUS | Status: AC
  Administered 2021-01-26: 1000 mL via INTRAVENOUS

## 2021-01-26 MED ORDER — DEXTROSE 50 % IV SOLN
INTRAVENOUS | Status: AC
  Filled 2021-01-26: qty 50

## 2021-01-26 MED ORDER — ROCURONIUM BROMIDE 50 MG/5ML IV SOLN
75.0000 mg | Freq: Once | INTRAVENOUS | Status: AC
  Administered 2021-01-26: 75 mg via INTRAVENOUS
  Filled 2021-01-26: qty 7.5

## 2021-01-26 MED ORDER — SODIUM BICARBONATE 8.4 % IV SOLN
INTRAVENOUS | Status: AC
  Filled 2021-01-26: qty 50

## 2021-01-26 MED ORDER — SODIUM BICARBONATE 8.4 % IV SOLN
INTRAVENOUS | Status: DC
  Filled 2021-01-26 (×5): qty 850

## 2021-01-26 MED ORDER — SODIUM BICARBONATE 8.4 % IV SOLN
100.0000 meq | Freq: Once | INTRAVENOUS | Status: AC
  Administered 2021-01-26: 100 meq via INTRAVENOUS
  Filled 2021-01-26: qty 100

## 2021-01-26 MED ORDER — NOREPINEPHRINE 4 MG/250ML-% IV SOLN
0.0000 ug/min | INTRAVENOUS | Status: DC
  Administered 2021-01-26: 10 ug/min via INTRAVENOUS
  Filled 2021-01-26 (×3): qty 250

## 2021-01-26 MED ORDER — SODIUM CHLORIDE 0.9 % IV SOLN
1.0000 g | INTRAVENOUS | Status: DC
  Filled 2021-01-26: qty 1

## 2021-01-26 MED ORDER — DEXTROSE 50 % IV SOLN: 50.0000 mL | Freq: Once | INTRAVENOUS | Status: AC

## 2021-01-26 MED ORDER — SODIUM CHLORIDE 0.9 % IV SOLN
2.0000 g | Freq: Once | INTRAVENOUS | Status: AC
  Administered 2021-01-26: 2 g via INTRAVENOUS
  Filled 2021-01-26: qty 2

## 2021-01-26 MED ORDER — LACTATED RINGERS IV BOLUS
1000.0000 mL | Freq: Once | INTRAVENOUS | Status: AC
  Administered 2021-01-26: 1000 mL via INTRAVENOUS

## 2021-01-26 MED ORDER — EPINEPHRINE PF 1 MG/ML IJ SOLN
0.5000 ug/min | INTRAVENOUS | Status: DC
  Filled 2021-01-26: qty 4

## 2021-01-26 MED ORDER — HYDROCORTISONE NA SUCCINATE PF 100 MG IJ SOLR
100.0000 mg | Freq: Once | INTRAMUSCULAR | Status: AC
  Administered 2021-01-26: 100 mg via INTRAVENOUS
  Filled 2021-01-26: qty 2

## 2021-01-26 MED ORDER — VANCOMYCIN HCL IN DEXTROSE 1-5 GM/200ML-% IV SOLN
1000.0000 mg | Freq: Once | INTRAVENOUS | Status: AC
  Administered 2021-01-26: 1000 mg via INTRAVENOUS
  Filled 2021-01-26: qty 200

## 2021-01-26 MED ORDER — SODIUM CHLORIDE 0.9 % IV SOLN: 2.0000 g | Freq: Once | INTRAVENOUS | Status: DC

## 2021-01-26 MED ORDER — DOBUTAMINE IN D5W 4-5 MG/ML-% IV SOLN
2.5000 ug/kg/min | INTRAVENOUS | Status: DC
  Administered 2021-01-26: 2.5 ug/kg/min via INTRAVENOUS
  Filled 2021-01-26: qty 250

## 2021-01-26 MED ORDER — SODIUM BICARBONATE 8.4 % IV SOLN
100.0000 meq | Freq: Once | INTRAVENOUS | Status: AC
  Administered 2021-01-26: 100 meq via INTRAVENOUS

## 2021-01-26 MED ORDER — NOREPINEPHRINE BITARTRATE 1 MG/ML IV SOLN
4.0000 mg | Freq: Once | INTRAVENOUS | Status: DC
  Filled 2021-01-26: qty 4

## 2021-01-26 MED ORDER — HEPARIN SODIUM (PORCINE) 5000 UNIT/ML IJ SOLN: 5000.0000 [IU] | Freq: Three times a day (TID) | INTRAMUSCULAR | Status: DC

## 2021-01-27 LAB — BLOOD CULTURE ID PANEL (REFLEXED) - BCID2

## 2021-01-27 MED FILL — Medication: Qty: 1 | Status: AC

## 2021-01-29 LAB — CULTURE, BLOOD (ROUTINE X 2)
Special Requests: ADEQUATE
Special Requests: ADEQUATE

## 2021-02-04 NOTE — ED Triage Notes (Signed)
ED provider called to bedside upon pt's arrival, right femoral central line placed,, pt intubated @0427 , 7.5 french 24in @lip , positive color change.

## 2021-02-04 NOTE — ED Notes (Signed)
Date and time results received: Feb 01, 2021 0652 (use smartphrase ".now" to insert current time)  Test: arterial Ph  Critical Value: 6.9  Name of Provider Notified: Dr. Preston Fleeting  Orders Received? Or Actions Taken?: see chart

## 2021-02-04 NOTE — ED Notes (Signed)
Parents and chaplain to bedside.

## 2021-02-04 NOTE — ED Notes (Signed)
Mother did not take personal belongings. Clothing placed in bag and sent to morgue. Patient has silver ring to left hand, nose and right ear in place. Noted with gold bracelet to left wrist. Placed in body bag at this time and taken to morgue by security and [employer]. Family remains undecided about funeral home at this time.

## 2021-02-04 NOTE — Progress Notes (Signed)
Referral from Unit Nurse that patient had come in to ED earlier in the morning and has passed. Family at bedside and staff/family requesting spiritual support. Arrived and found patient mother, Chelsey Torres, father, Chelsey Torres, and Aunt Chelsey Torres bedside and very tearful. Provided spiritual support, affirmation of faith, prayer, and coordination of care with IDT and family today. Chelsey Torres will remain available in order to provide spiritual support and to assess for spiritual need.

## 2021-02-04 NOTE — ED Triage Notes (Signed)
Pt arrived via EMS, pt's color is dusky, pt nods/shakes head to questions. Pupils are very dilated and non responsive. HR in 130s, having difficulty obtaining SpO2, pt breathing with retractions.

## 2021-02-04 NOTE — ED Notes (Signed)
Mother remains at bedside, no funeral home is determined at this time.

## 2021-02-04 NOTE — Death Summary Note (Signed)
Death Summary  Chelsey Torres:[id number] DOB: Apr 10, 1986 DOA: 2021-02-14  PCP: Patient, No Pcp Per  Admit date: February 14, 2021 Date of Death: February 14, 2021 Time of Death: 9:11 am Notification: Patient, No Pcp Per notified of death of 2021/02/14   History of present illness:  Chelsey Torres is a 35 y.o. female with a history of anxiety and depression.  Chelsey Torres presented with complaint of altered mental status  Chelsey Torres did not improve after aggressive medical therapy.   35 year old female who has been not feeling well for about 3 days, positive back pain at home. She was brought unresponsive to the emergency room now she has advanced airway, she is on mechanical ventilation and vasopressors. Her vital signs at the time of my examination blood pressure 66/20, mean arterial pressure 34, heart rate 134, respiratory 32, oxygenation 94% on mechanical ventilation 100% FiO2. Patient on norepinephrine, has received 4750 mL of intravenous fluids, high-dose steroids and now on a bicarb drip. She has features of severe brain damage with no corneal reflex or gag reflex,  Her lungs had rales bilaterally, heart S1-S2, present tachycardic, her abdomen is distended, no lower extremity edema, she has diffuse severe distal cyanosis.  Point-of-care bedside ultrasonography with diffuse bilateral B-lines, echocardiography with reduced LV systolic function.  Arterial blood gas, pH 6.95, PCO2 46.7, PO2 47.5, bicarb 8.3, oxygen saturation 58.5. Sodium 130, potassium 5.6, chloride 91, bicarb 10, glucose 87, BUN 68, creatinine 5.98, anion gap 29.  Troponin I 209-367, lactic acid greater than 11. White count 3.7, hemoglobin 13.8, hematocrit 45.3, platelets 70. SARS COVID-19 negative. Head CT no acute changes.  Chest radiograph with ET tube above the carina, NG tube in place, tip below the diaphragm.  Bilateral ischial infiltrates, multilobar, diffuse.  CT chest with extensive bilateral upper lower lung  zone multifocal areas of irregular nodularity, interstitial thickening and groundglass attenuation.  Number of irregular nodules have central areas of lucency compatible with cavitation. Small bilateral pleural effusions. Interstitial edema. Edema within bilateral pelvic sidewalls.  Chelsey Torres was admitted with the working diagnosis of septic shock due to endocarditis, complicated with multiorgan failure, severe metabolic acidosis.  Despite aggressive medical therapy, mechanical ventilation, broad-spectrum antibiotic therapy and vasopressors her hemodynamic continue rapidly deteriorate, Her condition was discussed with her mother and CODE STATUS was changed to DNR.    Final Diagnoses:   1.  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  Acute kidney injury with severe anion gap metabolic acidosis 3.  Metabolic encephalopathy   The results of significant diagnostics from this hospitalization (including imaging, microbiology, ancillary and laboratory) are listed below for reference.    Significant Diagnostic Studies: CT Head Wo Contrast  Result Date: 2021-02-14 CLINICAL DATA:  Possible drug overdose. EXAM: CT HEAD WITHOUT CONTRAST TECHNIQUE: Contiguous axial images were obtained from the base of the skull through the vertex without intravenous contrast. COMPARISON:  09/08/2007 FINDINGS: Brain: No evidence of acute infarction, hemorrhage, hydrocephalus, extra-axial collection or mass lesion/mass effect. Vascular: No hyperdense vessel or unexpected calcification. Skull: Normal. Negative for fracture or focal lesion. Sinuses/Orbits: No acute finding. Other: None IMPRESSION: No acute intracranial abnormalities. Normal brain. Electronically Signed   By: Signa Kell M.D.   On: 14-Feb-2021 06:40   DG Chest Port 1 View  Result Date: 02-14-21 CLINICAL DATA:  Intubation EXAM: PORTABLE CHEST 1 VIEW COMPARISON:  11/11/2013 FINDINGS: Patchy and nodular airspace  disease throughout the bilateral lungs. No air fluid level. No visible effusion or  air leak. The enteric tube loops in the stomach with tip at the distal stomach. The endotracheal tube tip is at the clavicular heads. Normal heart size. IMPRESSION: 1. Unremarkable hardware. 2. Diffuse nodular airspace disease, question septic emboli. Electronically Signed   By: Marnee Spring M.D.   On: February 06, 2021 05:40   CT CHEST ABDOMEN PELVIS WO CONTRAST  Result Date: 02/06/2021 CLINICAL DATA:  Evaluate for retroperitoneal hematoma. EXAM: CT CHEST, ABDOMEN AND PELVIS WITHOUT CONTRAST TECHNIQUE: Multidetector CT imaging of the chest, abdomen and pelvis was performed following the standard protocol without IV contrast. COMPARISON:  None. FINDINGS: CT CHEST FINDINGS Cardiovascular: No significant vascular findings. Normal heart size. No pericardial effusion. Mediastinum/Nodes: ET tube tip is above the carina. There is a nasogastric tube with tip in the distal stomach. Normal appearance of the thyroid gland. No enlarged thoracic lymph nodes. Lungs/Pleura: Small bilateral pleural effusions are identified left greater than right. Extensive bilateral upper and lower lung zone multifocal areas of irregular nodularity, interstitial thickening, and ground-glass attenuation. Confluent areas of nodularity measure up to 4.8 cm. A number of the irregular nodules have central areas of lucency compatible with cavitation. Focal area of atelectasis and volume loss noted within the lingula and right middle lobe. Musculoskeletal: No chest wall mass or suspicious bone lesions identified. CT ABDOMEN PELVIS FINDINGS Hepatobiliary: No focal liver abnormality is seen. No gallstones, gallbladder wall thickening, or biliary dilatation. Pancreas: Unremarkable. No pancreatic ductal dilatation or surrounding inflammatory changes. Spleen: Normal in size without focal abnormality. Adrenals/Urinary Tract: Adrenal glands are unremarkable. Kidneys are normal,  without renal calculi, focal lesion, or hydronephrosis. Bladder collapsed around a Foley catheter. Stomach/Bowel: NG tube tip is in the gastric antrum. There is mild diffuse increase caliber of the colon up to the level of the splenic flexure. No pathologically enlarged small bowel loops. Vascular/Lymphatic: Apparent decreased caliber of the IVC. Tip of right femoral central venous catheter is in the junction between the IVC and right common iliac vein. No abdominopelvic adenopathy. Reproductive: Uterus and bilateral adnexa are unremarkable. Other: No free fluid. Edema is identified within bilateral pelvic sidewalls and presacral soft tissue space. No focal fluid collections identified. Musculoskeletal: No acute or significant osseous findings. IMPRESSION: 1. Extensive bilateral upper and lower lung zone multifocal areas of irregular nodularity, interstitial thickening, and ground-glass attenuation are identified. A number of the irregular nodules have central areas of lucency compatible with cavitation. Imaging findings are concerning for diffuse pulmonary infection, likely septic emboli. 2. Small bilateral pleural effusions and interstitial edema compatible with fluid overload state. 3. Mild diffuse increase caliber of the colon up to the level of the splenic flexure. No focal fluid collections identified. Findings may reflect colonic ileus. 4. Apparent decreased caliber of the IVC which may be seen with hypovolemia. 5. Edema within bilateral pelvic sidewalls and presacral soft tissue space. No focal fluid collections identified. 6. Tip of right femoral central venous catheter is in the junction between the IVC and right common iliac vein. 7. These results were called by telephone at the time of interpretation on 02-06-21 at 6:55 am to provider DAVID [facility] , who verbally acknowledged these results. Electronically Signed   By: Signa Kell M.D.   On: 02/06/2021 06:55    Microbiology: Recent Results (from the  past 240 hour(s))  Resp Panel by RT-PCR (Flu A&B, Covid) Nasopharyngeal Swab     Status: None   Collection Time: 02-06-21  4:29 AM   Specimen: Nasopharyngeal Swab; Nasopharyngeal(NP) swabs in vial transport medium  Result Value Ref Range Status   SARS Coronavirus 2 by RT PCR NEGATIVE NEGATIVE Final    Comment: (NOTE) SARS-CoV-2 target nucleic acids are NOT DETECTED.  The SARS-CoV-2 RNA is generally detectable in upper respiratory specimens during the acute phase of infection. The lowest concentration of SARS-CoV-2 viral copies this assay can detect is 138 copies/mL. A negative result does not preclude SARS-Cov-2 infection and should not be used as the sole basis for treatment or other patient management decisions. A negative result may occur with  improper specimen collection/handling, submission of specimen other than nasopharyngeal swab, presence of viral mutation(s) within the areas targeted by this assay, and inadequate number of viral copies(<138 copies/mL). A negative result must be combined with clinical observations, patient history, and epidemiological information. The expected result is Negative.  Fact Sheet for Patients:  BloggerCourse.com  Fact Sheet for Healthcare Providers:  SeriousBroker.it  This test is no t yet approved or cleared by the [place] FDA and  has been authorized for detection and/or diagnosis of SARS-CoV-2 by FDA under an Emergency Use Authorization (EUA). This EUA will remain  in effect (meaning this test can be used) for the duration of the COVID-19 declaration under Section 564(b)(1) of the Act, 21 U.S.C.section 360bbb-3(b)(1), unless the authorization is terminated  or revoked sooner.       Influenza A by PCR NEGATIVE NEGATIVE Final   Influenza B by PCR NEGATIVE NEGATIVE Final    Comment: (NOTE) The Xpert Xpress SARS-CoV-2/FLU/RSV plus assay is intended as an aid in the diagnosis of  influenza from Nasopharyngeal swab specimens and should not be used as a sole basis for treatment. Nasal washings and aspirates are unacceptable for Xpert Xpress SARS-CoV-2/FLU/RSV testing.  Fact Sheet for Patients: BloggerCourse.com  Fact Sheet for Healthcare Providers: SeriousBroker.it  This test is not yet approved or cleared by the [place] FDA and has been authorized for detection and/or diagnosis of SARS-CoV-2 by FDA under an Emergency Use Authorization (EUA). This EUA will remain in effect (meaning this test can be used) for the duration of the COVID-19 declaration under Section 564(b)(1) of the Act, 21 U.S.C. section 360bbb-3(b)(1), unless the authorization is terminated or revoked.  Performed at [facility], [address] [place], [place] [address]   Culture, blood (routine x 2)     Status: None (Preliminary result)   Collection Time: 02/14/21  4:45 AM   Specimen: BLOOD  Result Value Ref Range Status   Specimen Description BLOOD central line  Final   Special Requests   Final    BOTTLES DRAWN AEROBIC AND ANAEROBIC Blood Culture adequate volume Performed at [facility], [address], [place], [place] [address]    Culture PENDING  Incomplete   Report Status PENDING  Incomplete  Culture, blood (routine x 2)     Status: None (Preliminary result)   Collection Time: Feb 14, 2021  5:11 AM   Specimen: BLOOD  Result Value Ref Range Status   Specimen Description BLOOD BLOOD RIGHT FOREARM  Final   Special Requests   Final    BOTTLES DRAWN AEROBIC AND ANAEROBIC Blood Culture adequate volume Performed at [facility], [address] [place], [place] [address]    Culture PENDING  Incomplete   Report Status PENDING  Incomplete     Labs: Basic Metabolic Panel: Recent Labs  Lab 2021/02/14 0400 02-14-2021 0600  NA 130*  --   K 5.6*  --   CL 91*  --   CO2 10*  --  GLUCOSE 87  --   BUN 68*  --   CREATININE 5.98* 6.37*   CALCIUM 9.3  --    Liver Function Tests: Recent Labs  Lab December 29, 2020 0400  AST 168*  ALT 83*  ALKPHOS 127*  BILITOT 4.8*  PROT 6.3*  ALBUMIN 2.5*   Recent Labs  Lab December 29, 2020 0400  LIPASE 20   No results for input(s): AMMONIA in the last 168 hours. CBC: Recent Labs  Lab December 29, 2020 0400 December 29, 2020 0600  WBC 3.7* 1.0*  NEUTROABS 1.1*  --   HGB 13.8 10.4*  HCT 45.3 32.4*  MCV 94.4 92.8  PLT 70* 30*   Cardiac Enzymes: No results for input(s): CKTOTAL, CKMB, CKMBINDEX, TROPONINI in the last 168 hours. D-Dimer No results for input(s): DDIMER in the last 72 hours. BNP: Invalid input(s): POCBNP CBG: Recent Labs  Lab December 29, 2020 0452 December 29, 2020 0516 December 29, 2020 0636  GLUCAP <10* 75 121*   Anemia work up No results for input(s): VITAMINB12, FOLATE, FERRITIN, TIBC, IRON, RETICCTPCT in the last 72 hours. Urinalysis    Component Value Date/Time   COLORURINE AMBER (A) 09-05-21 0810   APPEARANCEUR CLOUDY (A) 09-05-21 0810   LABSPEC 1.015 09-05-21 0810   PHURINE 6.0 09-05-21 0810   GLUCOSEU 150 (A) 09-05-21 0810   HGBUR MODERATE (A) 09-05-21 0810   BILIRUBINUR NEGATIVE 09-05-21 0810   KETONESUR NEGATIVE 09-05-21 0810   PROTEINUR >=300 (A) 09-05-21 0810   UROBILINOGEN 0.2 07/10/2009 1313   NITRITE NEGATIVE 09-05-21 0810   LEUKOCYTESUR SMALL (A) 09-05-21 0810   Sepsis Labs Invalid input(s): PROCALCITONIN,  WBC,  LACTICIDVEN     SIGNED:  Coralie KeensMauricio Daniel Javaria Knapke, MD  [facility] May 31, 2021, 12:16 PM Pager   If 7PM-7AM, please contact night-coverage [email or web address] Password [email or web address]

## 2021-02-04 NOTE — ED Notes (Signed)
Per Dr. Ella Jubilee, patient will not be ME case. AC aware.

## 2021-02-04 NOTE — ED Notes (Signed)
CRITICAL VENUS BLOOD GAS PH 6.8 PCO2 106 PO2 31   Reported to EDP Glick at this time

## 2021-02-04 NOTE — ED Provider Notes (Signed)
[facility] EMERGENCY DEPARTMENT Provider Note   CSN: [id number] Arrival date & time: 13-Feb-2021  0355   History Chief Complaint  Patient presents with  . Shortness of Breath    Chelsey Torres is a 35 y.o. female.  The history is provided by the EMS personnel. The history is limited by the condition of the patient (Patient unresponsive).  Shortness of Breath She has history of substance abuse and was brought in by ambulance.  Initial call was for difficulty breathing.  EMS reports that she was initially awake and alert but then became unresponsive.  IV was started in the field and ventilations were assisted.  Past Medical History:  Diagnosis Date  . Anxiety   . Depression   . Genital herpes   . Mastitis 09/13/2018  . Preterm delivery, delivered 09/07/2018  . Substance abuse [facility])     Patient Active Problem List   Diagnosis Date Noted  . Hepatitis C antibody test positive 08/30/2018  . GDM (gestational diabetes mellitus) 08/30/2018  . Supervision of high risk pregnancy, antepartum, second trimester 06/15/2018  . 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 H/O herpes genitalis 06/15/2018  . Low grade squamous intraepithelial lesion on cytologic smear of cervix (LGSIL) 06/15/2018  . Polysubstance abuse (HCC) 06/15/2018    Past Surgical History:  Procedure Laterality Date  . NO PAST SURGERIES       OB History    Gravida  3   Para  1   Term  0   Preterm  1   AB  2   Living  1     SAB  0   IAB  2   Ectopic  0   Multiple  0   Live Births  1           Family History  Problem Relation Age of Onset  . Asthma Brother     Social History   Tobacco Use  . Smoking status: Current Some Day Smoker    Packs/day: 0.50    Years: 10.00    Pack years: 5.00    Types: Cigarettes  . Smokeless tobacco: Never Used  Vaping Use  . Vaping Use: Every day  Substance Use Topics  . Alcohol use: Yes    Comment: socially  . Drug use: Yes     Frequency: 0.2 times per week    Types: Marijuana    Comment: methadone, previous pain pills and heroin    Home Medications Prior to Admission medications   Medication Sig Start Date End Date Taking? Authorizing Provider  benzonatate (TESSALON) 100 MG capsule Take 1 capsule by mouth every 8 (eight) hours for cough. 02/09/19   Mardella Layman, MD  methadone (DOLOPHINE) 10 MG/ML solution Take 50 mg by mouth daily. [place], [employer], Historical, MD  predniSONE (STERAPRED UNI-PAK 21 TAB) 10 MG (21) TBPK tablet Take by mouth daily. Take as directed. 02/09/19   Mardella Layman, MD  Prenatal Multivit-Min-Fe-FA (PRENATAL VITAMINS) 0.8 MG tablet Take 1 tablet by mouth daily. 06/15/18   Pincus Large, DO    Allergies    Tylenol [acetaminophen]  Review of Systems   Review of Systems  Unable to perform ROS: Patient unresponsive  Respiratory: Positive for shortness of breath.     Physical Exam Updated Vital Signs BP (!) 72/59   Resp (!) 28   Ht 5\' 5"  (1.651 m)   Wt 52.2 kg   BMI 19.14 kg/m   Physical  Exam Vitals and nursing note reviewed.   Cachectic 35 year old female, unresponsive with ventilations being assisted by bag-valve-mask. Vital signs are significant for rapid respiratory rate and low blood pressure and rapid heart rate. Oxygen saturation was not able to be obtained by pulse oximetry. Head is normocephalic and atraumatic.  Pupils dilated and fixed. Oropharynx is clear. Neck is without adenopathy or JVD. Lungs have symmetric air movement. Chest moves symmetrically. Heart has regular rate and rhythm without murmur. Abdomen is soft, slightly distended. Extremities have no cyanosis or edema. Skin is generally mottled. Neurologic: Minimally responsive to painful stimuli.  ED Results / Procedures / Treatments   Labs (all labs ordered are listed, but only abnormal results are displayed) Labs Reviewed  CBC WITH DIFFERENTIAL/PLATELET - Abnormal; Notable for  the following components:      Result Value   WBC 3.7 (*)    Platelets 70 (*)    Neutro Abs 1.1 (*)    All other components within normal limits  COMPREHENSIVE METABOLIC PANEL - Abnormal; Notable for the following components:   Sodium 130 (*)    Potassium 5.6 (*)    Chloride 91 (*)    CO2 10 (*)    BUN 68 (*)    Creatinine, Ser 5.98 (*)    Total Protein 6.3 (*)    Albumin 2.5 (*)    AST 168 (*)    ALT 83 (*)    Alkaline Phosphatase 127 (*)    Total Bilirubin 4.8 (*)    GFR, Estimated 9 (*)    Anion gap 29 (*)    All other components within normal limits  LACTIC ACID, PLASMA - Abnormal; Notable for the following components:   Lactic Acid, Venous >11 (*)    All other components within normal limits  BLOOD GAS, VENOUS - Abnormal; Notable for the following components:   pH, Ven <6.8 (*)    pCO2, Ven 106 (*)    pO2, Ven <31.0 (*)    All other components within normal limits  SALICYLATE LEVEL - Abnormal; Notable for the following components:   Salicylate Lvl <7.0 (*)    All other components within normal limits  BLOOD GAS, ARTERIAL - Abnormal; Notable for the following components:   pH, Arterial 6.952 (*)    pO2, Arterial 47.5 (*)    Bicarbonate 8.3 (*)    Acid-base deficit 19.7 (*)    Allens test (pass/fail) BRACHIAL ARTERY (*)    All other components within normal limits  CBG MONITORING, ED - Abnormal; Notable for the following components:   Glucose-Capillary <10 (*)    All other components within normal limits  CBG MONITORING, ED - Abnormal; Notable for the following components:   Glucose-Capillary 121 (*)    All other components within normal limits  TROPONIN I (HIGH SENSITIVITY) - Abnormal; Notable for the following components:   Troponin I (High Sensitivity) 209 (*)    All other components within normal limits  RESP PANEL BY RT-PCR (FLU A&B, COVID) ARPGX2  CULTURE, BLOOD (ROUTINE X 2)  CULTURE, BLOOD (ROUTINE X 2)  LIPASE, BLOOD  ACETAMINOPHEN LEVEL  ETHANOL   LACTIC ACID, PLASMA  URINALYSIS, ROUTINE W REFLEX MICROSCOPIC  RAPID URINE DRUG SCREEN, HOSP PERFORMED  CBG MONITORING, ED  TROPONIN I (HIGH SENSITIVITY)   Radiology CT Head Wo Contrast  Result Date: 02/07/2021 CLINICAL DATA:  Possible drug overdose. EXAM: CT HEAD WITHOUT CONTRAST TECHNIQUE: Contiguous axial images were obtained from the base of the skull through the vertex  without intravenous contrast. COMPARISON:  09/08/2007 FINDINGS: Brain: No evidence of acute infarction, hemorrhage, hydrocephalus, extra-axial collection or mass lesion/mass effect. Vascular: No hyperdense vessel or unexpected calcification. Skull: Normal. Negative for fracture or focal lesion. Sinuses/Orbits: No acute finding. Other: None IMPRESSION: No acute intracranial abnormalities. Normal brain. Electronically Signed   By: Signa Kell M.D.   On: 02-03-2021 06:40   DG Chest Port 1 View  Result Date: Feb 03, 2021 CLINICAL DATA:  Intubation EXAM: PORTABLE CHEST 1 VIEW COMPARISON:  11/11/2013 FINDINGS: Patchy and nodular airspace disease throughout the bilateral lungs. No air fluid level. No visible effusion or air leak. The enteric tube loops in the stomach with tip at the distal stomach. The endotracheal tube tip is at the clavicular heads. Normal heart size. IMPRESSION: 1. Unremarkable hardware. 2. Diffuse nodular airspace disease, question septic emboli. Electronically Signed   By: Marnee Spring M.D.   On: 02/03/21 05:40   CT CHEST ABDOMEN PELVIS WO CONTRAST  Result Date: 02/03/21 CLINICAL DATA:  Evaluate for retroperitoneal hematoma. EXAM: CT CHEST, ABDOMEN AND PELVIS WITHOUT CONTRAST TECHNIQUE: Multidetector CT imaging of the chest, abdomen and pelvis was performed following the standard protocol without IV contrast. COMPARISON:  None. FINDINGS: CT CHEST FINDINGS Cardiovascular: No significant vascular findings. Normal heart size. No pericardial effusion. Mediastinum/Nodes: ET tube tip is above the carina. There  is a nasogastric tube with tip in the distal stomach. Normal appearance of the thyroid gland. No enlarged thoracic lymph nodes. Lungs/Pleura: Small bilateral pleural effusions are identified left greater than right. Extensive bilateral upper and lower lung zone multifocal areas of irregular nodularity, interstitial thickening, and ground-glass attenuation. Confluent areas of nodularity measure up to 4.8 cm. A number of the irregular nodules have central areas of lucency compatible with cavitation. Focal area of atelectasis and volume loss noted within the lingula and right middle lobe. Musculoskeletal: No chest wall mass or suspicious bone lesions identified. CT ABDOMEN PELVIS FINDINGS Hepatobiliary: No focal liver abnormality is seen. No gallstones, gallbladder wall thickening, or biliary dilatation. Pancreas: Unremarkable. No pancreatic ductal dilatation or surrounding inflammatory changes. Spleen: Normal in size without focal abnormality. Adrenals/Urinary Tract: Adrenal glands are unremarkable. Kidneys are normal, without renal calculi, focal lesion, or hydronephrosis. Bladder collapsed around a Foley catheter. Stomach/Bowel: NG tube tip is in the gastric antrum. There is mild diffuse increase caliber of the colon up to the level of the splenic flexure. No pathologically enlarged small bowel loops. Vascular/Lymphatic: Apparent decreased caliber of the IVC. Tip of right femoral central venous catheter is in the junction between the IVC and right common iliac vein. No abdominopelvic adenopathy. Reproductive: Uterus and bilateral adnexa are unremarkable. Other: No free fluid. Edema is identified within bilateral pelvic sidewalls and presacral soft tissue space. No focal fluid collections identified. Musculoskeletal: No acute or significant osseous findings. IMPRESSION: 1. Extensive bilateral upper and lower lung zone multifocal areas of irregular nodularity, interstitial thickening, and ground-glass attenuation are  identified. A number of the irregular nodules have central areas of lucency compatible with cavitation. Imaging findings are concerning for diffuse pulmonary infection, likely septic emboli. 2. Small bilateral pleural effusions and interstitial edema compatible with fluid overload state. 3. Mild diffuse increase caliber of the colon up to the level of the splenic flexure. No focal fluid collections identified. Findings may reflect colonic ileus. 4. Apparent decreased caliber of the IVC which may be seen with hypovolemia. 5. Edema within bilateral pelvic sidewalls and presacral soft tissue space. No focal fluid collections identified. 6. Tip of  right femoral central venous catheter is in the junction between the IVC and right common iliac vein. 7. These results were called by telephone at the time of interpretation on 02-16-21 at 6:55 am to provider DAVID [facility] , who verbally acknowledged these results. Electronically Signed   By: Signa Kell M.D.   On: 02-16-2021 06:55    Procedures .Central Line  Date/Time: February 16, 2021 4:00 AM Performed by: Dione Booze, MD Authorized by: Dione Booze, MD   Consent:    Consent obtained:  Emergent situation   Risks, benefits, and alternatives were discussed: not applicable   Universal protocol:    Relevant documents present and verified: yes     Required blood products, implants, devices, and special equipment available: yes     Site/side marked: yes     Immediately prior to procedure, a time out was called: yes     Patient identity confirmed:  Hospital-assigned identification number and arm band Pre-procedure details:    Hand hygiene: Hand hygiene performed prior to insertion     Sterile barrier technique: All elements of maximal sterile technique followed     Skin preparation:  Chlorhexidine   Skin preparation agent: Skin preparation agent completely dried prior to procedure   Sedation:    Sedation type:  None Anesthesia:    Anesthesia method:   None Procedure details:    Location:  R femoral   Site selection rationale:  Ease of access   Patient position:  Supine   Procedural supplies:  Triple lumen   Catheter size:  5 Fr   Landmarks identified: yes     Ultrasound guidance: no     Number of attempts:  1   Successful placement: yes   Post-procedure details:    Post-procedure:  Dressing applied and line sutured   Assessment:  Blood return through all ports and free fluid flow   Procedure completion:  Tolerated well, no immediate complications Procedure Name: Intubation Date/Time: 2021-02-16 4:10 AM Performed by: Dione Booze, MD Pre-anesthesia Checklist: Patient identified, Patient being monitored, Emergency Drugs available, Timeout performed and Suction available Oxygen Delivery Method: Non-rebreather mask Preoxygenation: Pre-oxygenation with 100% oxygen Induction Type: Rapid sequence Ventilation: Mask ventilation without difficulty Laryngoscope Size: Glidescope and 3 Grade View: Grade I Tube size: 7.5 mm Number of attempts: 1 Airway Equipment and Method: Rigid stylet and Video-laryngoscopy Placement Confirmation: ETT inserted through vocal cords under direct vision,  CO2 detector and Breath sounds checked- equal and bilateral Secured at: 24 cm Tube secured with: ETT holder Dental Injury: Teeth and Oropharynx as per pre-operative assessment       CRITICAL CARE Performed by: Dione Booze Total critical care time: 180 minutes Critical care time was exclusive of separately billable procedures and treating other patients. Critical care was necessary to treat or prevent imminent or life-threatening deterioration. Critical care was time spent personally by me on the following activities: development of treatment plan with patient and/or surrogate as well as nursing, discussions with consultants, evaluation of patient's response to treatment, examination of patient, obtaining history from patient or surrogate, ordering and  performing treatments and interventions, ordering and review of laboratory studies, ordering and review of radiographic studies, pulse oximetry and re-evaluation of patient's condition.  Medications Ordered in ED Medications  sodium bicarbonate 150 mEq in dextrose 5 % 1,000 mL infusion ( Intravenous New Bag/Given 02/16/2021 0550)  norepinephrine (LEVOPHED)  in premix infusion (40 mcg/min Intravenous Rate/Dose Change 2021/02/16 0552)  lactated ringers infusion (has no administration in time range)  lactated ringers bolus 1,000 mL (1,000 mLs Intravenous New Bag/Given 04-25-2021 0656)    And  lactated ringers bolus 500 mL (has no administration in time range)    And  lactated ringers bolus 250 mL (250 mLs Intravenous New Bag/Given 04-25-2021 0710)  metroNIDAZOLE (FLAGYL) IVPB 500 mg (500 mg Intravenous New Bag/Given 04-25-2021 0700)  vancomycin (VANCOCIN) IVPB 1000 mg/200 mL premix (1,000 mg Intravenous New Bag/Given 04-25-2021 0658)  ceFEPIme (MAXIPIME) 1 g in sodium chloride 0.9 % 100 mL IVPB (has no administration in time range)  vancomycin variable dose per unstable renal function (pharmacist dosing) (has no administration in time range)  sodium chloride 0.9 % bolus 1,000 mL (0 mLs Intravenous Stopped 04-25-2021 0519)  lactated ringers bolus 1,000 mL (0 mLs Intravenous Stopped 04-25-2021 0505)  etomidate (AMIDATE) injection 20 mg (20 mg Intravenous Given 04-25-2021 0426)  rocuronium (ZEMURON) injection 75 mg (75 mg Intravenous Given 04-25-2021 0426)  sodium bicarbonate injection 100 mEq (100 mEq Intravenous Given 04-25-2021 0533)  dextrose 50 % solution 50 mL ( Intravenous Given 04-25-2021 0456)  lactated ringers bolus 1,000 mL (1,000 mLs Intravenous New Bag/Given 04-25-2021 0537)  sodium bicarbonate injection 100 mEq (100 mEq Intravenous Given 04-25-2021 0705)  hydrocortisone sodium succinate (SOLU-CORTEF) 100 MG injection 100 mg (100 mg Intravenous Given 04-25-2021 0710)    ED Course  I have reviewed the triage vital  signs and the nursing notes.  Pertinent labs & imaging results that were available during my care of the patient were reviewed by me and considered in my medical decision making (see chart for details).  MDM Rules/Calculators/A&P Patient arrived appearing moribund with barely palpable pulses and very low blood pressure.  Right femoral line was inserted and IV fluid resuscitation was begun.  CBG was obtained which was less than 10 and she was given dextrose.  In spite of fluids, she remained hypotensive.  Fluids were continued and she was started on pressors.  Blood pressure still remained low.  Glucose only came up to 80 with 1 amp of dextrose and she was given a second amp of dextrose.  Blood gas showed severe acidosis.  Labs show renal failure with metabolic acidosis.  She was given sodium bicarb and started on a bicarbonate drip.  Chest x-ray showed adequate tube placement and opacities consistent with Covid but also consistent with septic emboli.  Lactic acid has come back greater than 11 and this was felt to be primarily from hypotension.  However, because of concern for possible septic emboli, she is started on antibiotics as well.  Also, an attempt to explain refractory hypotension, she was sent for a CT of chest, abdomen, pelvis which once again showed pulmonary infiltrates concerning for possible septic emboli.  CT of head was unremarkable.  Patient remains hypotensive in spite of massive fluid resuscitation.  Blood gas is being repeated to see if she needs additional sodium bicarbonate.  At this point, she has still not produced any urine for evaluation.  Old records are reviewed, and she has no relevant past visits.   Blood gas continues to show severe acidosis and she is given additional sodium bicarbonate.  Also, will give hydrocortisone intravenously to see if she is relatively addisonian.  Final Clinical Impression(s) / ED Diagnoses Final diagnoses:  Hypotension  Shock, unspecified (HCC)   Acute renal failure, unspecified acute renal failure type (HCC)  Metabolic acidosis  Hypoglycemia  Elevated transaminase level  Elevated troponin  Hyponatremia    Rx / DC Orders ED Discharge Orders  None       Dione Booze, MD 04-Feb-2021 2242

## 2021-02-04 NOTE — ED Provider Notes (Addendum)
Received care from Dr. Preston Fleeting.  Briefly this is a 35 year old female who presented to the over 19 respiratory distress.  She was intubated on arrival, has a femoral central line.  Physical Exam  BP (!) 66/20   Pulse (!) 136   Temp (!) 93.4 F (34.1 C) (Core)   Resp (!) 32   Ht 5\' 5"  (1.651 m)   Wt 52.2 kg   SpO2 94%   BMI 19.14 kg/m   Physical Exam Constitutional:      Comments: Intubated and sedated  HENT:     Head: Normocephalic.  Eyes:     Comments: Pupils are dilated and fixed  Cardiovascular:     Rate and Rhythm: Tachycardia present.  Pulmonary:     Comments: On the ventilator Abdominal:     Palpations: Abdomen is soft.  Skin:    Coloration: Skin is cyanotic.     Comments: Mottled  Neurological:     Comments: Sedated     ED Course/Procedures     .Critical Care Performed by: , DO Authorized by: Rozelle Logan, DO   Critical care provider statement:    Critical care time (minutes):  40   Critical care was time spent personally by me on the following activities:  Discussions with consultants, evaluation of patient's response to treatment, examination of patient, ordering and performing treatments and interventions, ordering and review of laboratory studies, ordering and review of radiographic studies, pulse oximetry, re-evaluation of patient's condition, obtaining history from patient or surrogate and review of old charts   I assumed direction of critical care for this patient from another provider in my specialty: yes      MDM   Patient is severely sick.  Appears to be septic, acidotic, possible septic emboli seen on imaging.  Antibiotics have been given.  Patient has been adequately resuscitated with fluids, bicarb.  She is currently maxed out on nor epi.  Patient continues to be hypotensive with systolics in the 60s, map around 30 or below.  We will admit to the ICU here.  ICU team aware of patient, not stable for transfer. Contacted the mother  this morning, she will be coming in to see the patient and participate in medical making decisions.  [facility] hospitalist have been contacted, they have accepted the patient and will come evaluate her.  They have asked for the addition of epinephrine for pressure support.  This has been ordered.  It does not seem that the patient will survive this event.  She has been admitted.      Rozelle Logan, DO 2021/02/13 0820    01/28/21, DO 01/27/21 [phone]

## 2021-02-04 NOTE — Progress Notes (Signed)
Pharmacy Antibiotic Note  TASHAWNA THOM is a 35 y.o. female admitted on 02-17-21 with sepsis.  Pharmacy has been consulted for Vancomycin and Cefepime dosing. Pt with AKI, Scr up to 5.98 (baseline <1).  Plan: Cefepime 1gm IV q24h Vancomycin 1gm IV now -further doses based on SCr trend Will f/u renal function, micro data, and pt's clinical condition   Height: 5\' 5"  (165.1 cm) Weight: 52.2 kg (115 lb) IBW/kg (Calculated) : 57  Temp (24hrs), Avg:93.4 F (34.1 C), Min:93.4 F (34.1 C), Max:93.4 F (34.1 C)  Recent Labs  Lab 17-Feb-2021 0400  WBC 3.7*  CREATININE 5.98*  LATICACIDVEN >11*    Estimated Creatinine Clearance: 10.7 mL/min (A) (by C-G formula based on SCr of 5.98 mg/dL (H)).    Allergies  Allergen Reactions  . Tylenol [Acetaminophen] Hives    Large doses    Antimicrobials this admission: 2/20 Vanc >>  2/20 Cefepime >>    Microbiology results: 2/20 BCx:   Thank you for allowing pharmacy to be a part of this patient's care.  3/20, PharmD, BCPS Please see amion for complete clinical pharmacist phone list 2021-02-17 6:38 AM

## 2021-02-04 NOTE — ED Notes (Signed)
Patient placement notified. 

## 2021-02-04 NOTE — Progress Notes (Signed)
Discussed case with EDP, too unstable for transfer at this time. Should she stabilize she can be transferred to [facility] for further care. Please reach out if questions or concerns through [employer].  Myrla Halsted MD PCCM

## 2021-02-04 NOTE — ED Notes (Signed)
CRITICAL VALUE: CBG LOW, Trop 209 and a lactic acid of >11 reported to EDP Glick at this time.

## 2021-02-04 NOTE — H&P (Signed)
History and Physical    Chelsey Torres:[id number] DOB: May 27, 1986 DOA: 02-06-21  PCP: Patient, No Pcp Per   Patient coming from: Home   Chief Complaint: Unresponsive.   HPI: Chelsey Torres is a 35 y.o. female with medical history significant of anxiety, depression and genital herpes who was brought to the ED by EMS due to unresponsiveness. She was found with dilated pupils bilaterally, only nodding and shaking with her head to questions, her heart rate 130s beats per minute, with severe respiratory distress.  On arrival to ED she was intubated for airway protection, placed on mechanical ventilation and a right femoral central line was placed. She remained hypotensive despite aggressive fluid resuscitation and she was placed on vasopressors. Patient unstable for transfer to tertiary care.  Apparently patient has been sick for about 3 days. Chart review, no recent hospitalizations.  Per her mother she has been complaining of back pain at home.   ED Course: Patient has been diagnosed with septic shock, placed on mechanical ventilation and vasopressors. Patient unstable to transfer to tertiary care.  Review of Systems: Unable to obtain due to patient's acute critical status.   Past Medical History:  Diagnosis Date  . Anxiety   . Depression   . Genital herpes   . Mastitis 09/13/2018  . Preterm delivery, delivered 09/07/2018  . Substance abuse [facility])     Past Surgical History:  Procedure Laterality Date  . NO PAST SURGERIES       reports that she has been smoking cigarettes. She has a 5.00 pack-year smoking history. She has never used smokeless tobacco. She reports current alcohol use. She reports current drug use. Frequency: 0.20 times per week. Drug: Marijuana.  Allergies  Allergen Reactions  . Tylenol [Acetaminophen] Hives    Large doses    Family History  Problem Relation Age of Onset  . Asthma Brother      Prior to Admission medications   Medication Sig Start  Date End Date Taking? Authorizing Provider  benzonatate (TESSALON) 100 MG capsule Take 1 capsule by mouth every 8 (eight) hours for cough. 02/09/19   Mardella Layman, MD  methadone (DOLOPHINE) 10 MG/ML solution Take 50 mg by mouth daily. [place], Historical, MD  predniSONE (STERAPRED UNI-PAK 21 TAB) 10 MG (21) TBPK tablet Take by mouth daily. Take as directed. 02/09/19   Mardella Layman, MD  Prenatal Multivit-Min-Fe-FA (PRENATAL VITAMINS) 0.8 MG tablet Take 1 tablet by mouth daily. 06/15/18   Pincus Large, DO    Physical Exam: Vitals:   Feb 06, 2021 0635 02/06/2021 0715 02/06/2021 0800 02/06/21 0807  BP: (!) 70/25 (!) 82/42 (!) 66/20   Pulse:      Resp: (!) 32 (!) 32 (!) 32   Temp:      TempSrc:      SpO2:  94%  (!) 60%  Weight:      Height:        Vitals:   02-06-2021 0635 02-06-2021 0715 02-06-2021 0800 06-Feb-2021 0807  BP: (!) 70/25 (!) 82/42 (!) 66/20   Pulse:      Resp: (!) 32 (!) 32 (!) 32   Temp:      TempSrc:      SpO2:  94%  (!) 60%  Weight:      Height:       General: unresponsive, ill looking appearing.   Neurology: no corneal or gag reflex, dilated pupils not reactive to light. Patient off sedation. Head and  Neck. Head normocephalic. Neck supple with no adenopathy or thyromegaly.   E ENT: positive pallor, dry mucous membranes, conjunctival edema bilaterally Cardiovascular: No JVD. S1-S2 present, rhythmic, tachycardic, no lower extremity edema  Pulmonary: Bilateral rales, on mechanical ventilation Gastrointestinal. Abdomen distended and firm. \ Skin. Diffuse cyanosis, distal upper and lower extremities. Musculoskeletal: no joint deformities    Labs on Admission: I have personally reviewed following labs and imaging studies  CBC: Recent Labs  Lab 20-Feb-2021 0400  WBC 3.7*  NEUTROABS 1.1*  HGB 13.8  HCT 45.3  MCV 94.4  PLT 70*   Basic Metabolic Panel: Recent Labs  Lab 2021/02/20 0400  NA 130*  K 5.6*  CL 91*  CO2 10*  GLUCOSE 87  BUN  68*  CREATININE 5.98*  CALCIUM 9.3   GFR: Estimated Creatinine Clearance: 10.7 mL/min (A) (by C-G formula based on SCr of 5.98 mg/dL (H)). Liver Function Tests: Recent Labs  Lab 02-20-21 0400  AST 168*  ALT 83*  ALKPHOS 127*  BILITOT 4.8*  PROT 6.3*  ALBUMIN 2.5*   Recent Labs  Lab 02/20/21 0400  LIPASE 20   No results for input(s): AMMONIA in the last 168 hours. Coagulation Profile: No results for input(s): INR, PROTIME in the last 168 hours. Cardiac Enzymes: No results for input(s): CKTOTAL, CKMB, CKMBINDEX, TROPONINI in the last 168 hours. BNP (last 3 results) No results for input(s): PROBNP in the last 8760 hours. HbA1C: No results for input(s): HGBA1C in the last 72 hours. CBG: Recent Labs  Lab 02/20/2021 0452 02-20-21 0516 2021/02/20 0636  GLUCAP <10* 75 121*   Lipid Profile: No results for input(s): CHOL, HDL, LDLCALC, TRIG, CHOLHDL, LDLDIRECT in the last 72 hours. Thyroid Function Tests: No results for input(s): TSH, T4TOTAL, FREET4, T3FREE, THYROIDAB in the last 72 hours. Anemia Panel: No results for input(s): VITAMINB12, FOLATE, FERRITIN, TIBC, IRON, RETICCTPCT in the last 72 hours. Urine analysis:    Component Value Date/Time   COLORURINE STRAW (A) 09/07/2018 0605   APPEARANCEUR CLEAR 09/07/2018 0605   LABSPEC 1.004 (L) 09/07/2018 0605   PHURINE 7.0 09/07/2018 0605   GLUCOSEU NEGATIVE 09/07/2018 0605   HGBUR SMALL (A) 09/07/2018 0605   BILIRUBINUR NEGATIVE 09/07/2018 0605   KETONESUR NEGATIVE 09/07/2018 0605   PROTEINUR NEGATIVE 09/07/2018 0605   UROBILINOGEN 0.2 07/10/2009 1313   NITRITE NEGATIVE 09/07/2018 0605   LEUKOCYTESUR TRACE (A) 09/07/2018 0605    Radiological Exams on Admission: CT Head Wo Contrast  Result Date: February 20, 2021 CLINICAL DATA:  Possible drug overdose. EXAM: CT HEAD WITHOUT CONTRAST TECHNIQUE: Contiguous axial images were obtained from the base of the skull through the vertex without intravenous contrast. COMPARISON:   09/08/2007 FINDINGS: Brain: No evidence of acute infarction, hemorrhage, hydrocephalus, extra-axial collection or mass lesion/mass effect. Vascular: No hyperdense vessel or unexpected calcification. Skull: Normal. Negative for fracture or focal lesion. Sinuses/Orbits: No acute finding. Other: None IMPRESSION: No acute intracranial abnormalities. Normal brain. Electronically Signed   By: Signa Kell M.D.   On: 02/20/21 06:40   DG Chest Port 1 View  Result Date: Feb 20, 2021 CLINICAL DATA:  Intubation EXAM: PORTABLE CHEST 1 VIEW COMPARISON:  11/11/2013 FINDINGS: Patchy and nodular airspace disease throughout the bilateral lungs. No air fluid level. No visible effusion or air leak. The enteric tube loops in the stomach with tip at the distal stomach. The endotracheal tube tip is at the clavicular heads. Normal heart size. IMPRESSION: 1. Unremarkable hardware. 2. Diffuse nodular airspace disease, question septic emboli. Electronically Signed   By:  Marnee Spring M.D.   On: Feb 10, 2021 05:40   CT CHEST ABDOMEN PELVIS WO CONTRAST  Result Date: 02-10-2021 CLINICAL DATA:  Evaluate for retroperitoneal hematoma. EXAM: CT CHEST, ABDOMEN AND PELVIS WITHOUT CONTRAST TECHNIQUE: Multidetector CT imaging of the chest, abdomen and pelvis was performed following the standard protocol without IV contrast. COMPARISON:  None. FINDINGS: CT CHEST FINDINGS Cardiovascular: No significant vascular findings. Normal heart size. No pericardial effusion. Mediastinum/Nodes: ET tube tip is above the carina. There is a nasogastric tube with tip in the distal stomach. Normal appearance of the thyroid gland. No enlarged thoracic lymph nodes. Lungs/Pleura: Small bilateral pleural effusions are identified left greater than right. Extensive bilateral upper and lower lung zone multifocal areas of irregular nodularity, interstitial thickening, and ground-glass attenuation. Confluent areas of nodularity measure up to 4.8 cm. A number of the  irregular nodules have central areas of lucency compatible with cavitation. Focal area of atelectasis and volume loss noted within the lingula and right middle lobe. Musculoskeletal: No chest wall mass or suspicious bone lesions identified. CT ABDOMEN PELVIS FINDINGS Hepatobiliary: No focal liver abnormality is seen. No gallstones, gallbladder wall thickening, or biliary dilatation. Pancreas: Unremarkable. No pancreatic ductal dilatation or surrounding inflammatory changes. Spleen: Normal in size without focal abnormality. Adrenals/Urinary Tract: Adrenal glands are unremarkable. Kidneys are normal, without renal calculi, focal lesion, or hydronephrosis. Bladder collapsed around a Foley catheter. Stomach/Bowel: NG tube tip is in the gastric antrum. There is mild diffuse increase caliber of the colon up to the level of the splenic flexure. No pathologically enlarged small bowel loops. Vascular/Lymphatic: Apparent decreased caliber of the IVC. Tip of right femoral central venous catheter is in the junction between the IVC and right common iliac vein. No abdominopelvic adenopathy. Reproductive: Uterus and bilateral adnexa are unremarkable. Other: No free fluid. Edema is identified within bilateral pelvic sidewalls and presacral soft tissue space. No focal fluid collections identified. Musculoskeletal: No acute or significant osseous findings. IMPRESSION: 1. Extensive bilateral upper and lower lung zone multifocal areas of irregular nodularity, interstitial thickening, and ground-glass attenuation are identified. A number of the irregular nodules have central areas of lucency compatible with cavitation. Imaging findings are concerning for diffuse pulmonary infection, likely septic emboli. 2. Small bilateral pleural effusions and interstitial edema compatible with fluid overload state. 3. Mild diffuse increase caliber of the colon up to the level of the splenic flexure. No focal fluid collections identified. Findings may  reflect colonic ileus. 4. Apparent decreased caliber of the IVC which may be seen with hypovolemia. 5. Edema within bilateral pelvic sidewalls and presacral soft tissue space. No focal fluid collections identified. 6. Tip of right femoral central venous catheter is in the junction between the IVC and right common iliac vein. 7. These results were called by telephone at the time of interpretation on 02/10/2021 at 6:55 am to provider DAVID [facility] , who verbally acknowledged these results. Electronically Signed   By: Signa Kell M.D.   On: 2021/02/10 06:55    EKG: Independently reviewed. NA  Assessment/Plan Principal Problem:   Severe sepsis with septic shock (HCC) Active Problems:   Polysubstance abuse (HCC)   Hepatitis C antibody test positive   Endocarditis   AKI (acute kidney injury) (HCC)   Acute metabolic encephalopathy    35 year old female who has been not feeling well for about 3 days, positive back pain at home. She was brought unresponsive to the emergency room now she has advanced airway, she is on mechanical ventilation and vasopressors. Her vital  signs at the time of my examination blood pressure 66/20, mean arterial pressure 34, heart rate 134, respiratory 32, oxygenation 94% on mechanical ventilation 100% FiO2. Patient on norepinephrine, has received 4750 mL of intravenous fluids, high-dose steroids and now on a bicarb drip. She has features of severe brain damage with no corneal reflex or gag reflex,  Her lungs had rales bilaterally, heart S1-S2, present tachycardic, her abdomen is distended, no lower extremity edema, she has diffuse severe distal cyanosis.  Point-of-care bedside ultrasonography with diffuse bilateral B-lines, echocardiography with reduced LV systolic function.  Arterial blood gas, pH 6.95, PCO2 46.7, PO2 47.5, bicarb 8.3, oxygen saturation 58.5. Sodium 130, potassium 5.6, chloride 91, bicarb 10, glucose 87, BUN 68, creatinine 5.98, anion gap 29.  Troponin I  209-367, lactic acid greater than 11. White count 3.7, hemoglobin 13.8, hematocrit 45.3, platelets 70. SARS COVID-19 negative. Head CT no acute changes.  Chest radiograph with ET tube above the carina, NG tube in place, tip below the diaphragm.  Bilateral ischial infiltrates, multilobar, diffuse.  CT chest with extensive bilateral upper lower lung zone multifocal areas of irregular nodularity, interstitial thickening and groundglass attenuation.  Number of irregular nodules have central areas of lucency compatible with cavitation. Small bilateral pleural effusions. Interstitial edema. Edema within bilateral pelvic sidewalls.  Chelsey Torres is being admitted with the working diagnosis of septic shock due to endocarditis, complicated with multiorgan failure, severe metabolic acidosis.   1.  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  Patient with refractive shock, despite volume resuscitation and aggressive vasopressor therapy. Patient has signs of severe endorgan damage including brain, heart, kidney and lung.  Continue vasopressor therapy with norepinephrine and epinephrine. Stress dose steroids.  Bedside echocardiography showed LV dysfunction, will add dobutamine. She has signs of volume overload, continue bicarb drip at 100 mill per hour.  She is not stable for renal replacement therapy because of ongoing rapid hypotension, and signs of severe brain damage.  Antibiotic therapy with vancomycin and cefepime.  I spoke with her mother at the bedside, explained her condition and poor prognosis, decision was made to change CODE STATUS DNR, continue medical therapy, no further up escalation.  2.  Acute hypoxic respiratory failure/ARDS.  Patient on mechanical ventilation, PRVC, PEEP 5, respiratory 32, tidal volume 450. Follow ARDS net protocol, 6 mL tidal volume per ideal body weight, PEEP per protocol.  Currently no sedation due to encephalopathy.   3.   Acute kidney injury with severe anion gap metabolic acidosis.  Discontinue lactated Ringer's in the setting of acidosis, continue fluids with bicarb drip. Restrictive IV fluid strategy, patient clinically volume overload. Patient unstable for renal replacement therapy.  4.  Encephalopathy.  Acute metabolic encephalopathy, no gag or corneal reflex, poor prognosis.   Patient with multiorgan failure, very poor prognosis, likely source of septic shock is endocarditis.  Patient had aggressive medical therapy in the ED for about 5 hours without meaningful improvement.  I explained patient's condition to her mother in detail at the bedside, all questions were addressed.  Status is: Inpatient  Remains inpatient appropriate because:IV treatments appropriate due to intensity of illness or inability to take PO   Dispo: The patient is from: Home              Anticipated d/c is to: Home              Anticipated d/c date is: > 3 days  Patient currently is not medically stable to d/c.   Difficult to place patient No   DVT prophylaxis: Heparin   Code Status:   DNR   Family Communication:  I spoke with patient's mother at the bedside, we talked in detail about patient's condition, plan of care and prognosis and all questions were addressed.     Consults called:  CC  Admission status:  Inpatient.    Mauricio Annett Gula MD [facility]   02-16-2021, 8:36 AM

## 2021-02-04 NOTE — ED Notes (Signed)
This nurse and Casimiro Needle, RN in room when noted with asystole on monitor. Verified by two nurses that patient is pulseless. Family at bedside. Dr. Ella Jubilee aware and time of death determined to be 0911.

## 2021-02-04 DEATH — deceased
